# Patient Record
Sex: Female | Born: 1940 | Race: White | Hispanic: No | Marital: Married | Smoking: Never smoker
Health system: Southern US, Community
[De-identification: ages and names within clinical notes are randomized; demographics above are authoritative.]

## PROBLEM LIST (undated history)

## (undated) DIAGNOSIS — J309 Allergic rhinitis, unspecified: Secondary | ICD-10-CM

## (undated) DIAGNOSIS — K219 Gastro-esophageal reflux disease without esophagitis: Secondary | ICD-10-CM

## (undated) HISTORY — PX: TONSILLECTOMY: SUR1361

---

## 1997-06-28 ENCOUNTER — Ambulatory Visit (HOSPITAL_COMMUNITY): Admission: RE | Admit: 1997-06-28 | Discharge: 1997-06-28 | Payer: Self-pay | Admitting: *Deleted

## 1998-05-31 ENCOUNTER — Other Ambulatory Visit: Admission: RE | Admit: 1998-05-31 | Discharge: 1998-05-31 | Payer: Self-pay | Admitting: *Deleted

## 2000-10-30 ENCOUNTER — Encounter: Payer: Self-pay | Admitting: Family Medicine

## 2000-10-30 ENCOUNTER — Ambulatory Visit (HOSPITAL_COMMUNITY): Admission: RE | Admit: 2000-10-30 | Discharge: 2000-10-30 | Payer: Self-pay | Admitting: Family Medicine

## 2011-05-03 ENCOUNTER — Emergency Department (HOSPITAL_COMMUNITY)
Admission: EM | Admit: 2011-05-03 | Discharge: 2011-05-03 | Disposition: A | Discharge status: Home / Self Care | Payer: BC Managed Care – PPO | Attending: Emergency Medicine | Admitting: Emergency Medicine

## 2011-05-03 ENCOUNTER — Encounter (HOSPITAL_COMMUNITY): Payer: Self-pay | Admitting: Emergency Medicine

## 2011-05-03 DIAGNOSIS — M25439 Effusion, unspecified wrist: Secondary | ICD-10-CM | POA: Insufficient documentation

## 2011-05-03 DIAGNOSIS — M79609 Pain in unspecified limb: Secondary | ICD-10-CM | POA: Insufficient documentation

## 2011-05-03 DIAGNOSIS — S61409A Unspecified open wound of unspecified hand, initial encounter: Secondary | ICD-10-CM | POA: Insufficient documentation

## 2011-05-03 DIAGNOSIS — M2550 Pain in unspecified joint: Secondary | ICD-10-CM | POA: Insufficient documentation

## 2011-05-03 DIAGNOSIS — Z79899 Other long term (current) drug therapy: Secondary | ICD-10-CM | POA: Insufficient documentation

## 2011-05-03 DIAGNOSIS — W268XXA Contact with other sharp object(s), not elsewhere classified, initial encounter: Secondary | ICD-10-CM | POA: Insufficient documentation

## 2011-05-03 DIAGNOSIS — S61419A Laceration without foreign body of unspecified hand, initial encounter: Secondary | ICD-10-CM

## 2011-05-03 DIAGNOSIS — K219 Gastro-esophageal reflux disease without esophagitis: Secondary | ICD-10-CM | POA: Insufficient documentation

## 2011-05-03 DIAGNOSIS — W01119A Fall on same level from slipping, tripping and stumbling with subsequent striking against unspecified sharp object, initial encounter: Secondary | ICD-10-CM | POA: Insufficient documentation

## 2011-05-03 HISTORY — DX: Gastro-esophageal reflux disease without esophagitis: K21.9

## 2011-05-03 MED ORDER — ACETAMINOPHEN-CODEINE #3 300-30 MG PO TABS
1.0000 | ORAL_TABLET | Freq: Once | ORAL | Status: AC
  Administered 2011-05-03: 1 via ORAL
  Filled 2011-05-03: qty 1

## 2011-05-03 MED ORDER — TETANUS-DIPHTH-ACELL PERTUSSIS 5-2.5-18.5 LF-MCG/0.5 IM SUSP
0.5000 mL | Freq: Once | INTRAMUSCULAR | Status: AC
  Administered 2011-05-03: 0.5 mL via INTRAMUSCULAR
  Filled 2011-05-03: qty 0.5

## 2011-05-03 MED ORDER — LIDOCAINE-EPINEPHRINE-TETRACAINE (LET) SOLUTION
3.0000 mL | Freq: Once | NASAL | Status: AC
  Administered 2011-05-03: 3 mL via TOPICAL
  Filled 2011-05-03: qty 3

## 2011-05-03 NOTE — Discharge Instructions (Signed)
Please read the information below.  Follow up with your doctor, the urgent care, or the ER in 14 days for a wound check and suture removal.  Return to the ER immediately if you develop redness, swelling, pus draining from the wound, or fevers greater than 100.4.  You may return to the ER at any time for worsening condition or any new symptoms that concern you.   Laceration Care, Adult A laceration is a cut or lesion that goes through all layers of the skin and into the tissue just beneath the skin. TREATMENT  Some lacerations may not require closure. Some lacerations may not be able to be closed due to an increased risk of infection. It is important to see your caregiver as soon as possible after an injury to minimize the risk of infection and maximize the opportunity for successful closure. If closure is appropriate, pain medicines may be given, if needed. The wound will be cleaned to help prevent infection. Your caregiver will use stitches (sutures), staples, wound glue (adhesive), or skin adhesive strips to repair the laceration. These tools bring the skin edges together to allow for faster healing and a better cosmetic outcome. However, all wounds will heal with a scar. Once the wound has healed, scarring can be minimized by covering the wound with sunscreen during the day for 1 full year. HOME CARE INSTRUCTIONS  For sutures or staples:  Keep the wound clean and dry.   If you were given a bandage (dressing), you should change it at least once a day. Also, change the dressing if it becomes wet or dirty, or as directed by your caregiver.   Wash the wound with soap and water 2 times a day. Rinse the wound off with water to remove all soap. Pat the wound dry with a clean towel.   After cleaning, apply a thin layer of the antibiotic ointment as recommended by your caregiver. This will help prevent infection and keep the dressing from sticking.   You may shower as usual after the first 24 hours. Do  not soak the wound in water until the sutures are removed.   Only take over-the-counter or prescription medicines for pain, discomfort, or fever as directed by your caregiver.   Get your sutures or staples removed as directed by your caregiver.  For skin adhesive strips:  Keep the wound clean and dry.   Do not get the skin adhesive strips wet. You may bathe carefully, using caution to keep the wound dry.   If the wound gets wet, pat it dry with a clean towel.   Skin adhesive strips will fall off on their own. You may trim the strips as the wound heals. Do not remove skin adhesive strips that are still stuck to the wound. They will fall off in time.  For wound adhesive:  You may briefly wet your wound in the shower or bath. Do not soak or scrub the wound. Do not swim. Avoid periods of heavy perspiration until the skin adhesive has fallen off on its own. After showering or bathing, gently pat the wound dry with a clean towel.   Do not apply liquid medicine, cream medicine, or ointment medicine to your wound while the skin adhesive is in place. This may loosen the film before your wound is healed.   If a dressing is placed over the wound, be careful not to apply tape directly over the skin adhesive. This may cause the adhesive to be pulled off before the  wound is healed.   Avoid prolonged exposure to sunlight or tanning lamps while the skin adhesive is in place. Exposure to ultraviolet light in the first year will darken the scar.   The skin adhesive will usually remain in place for 5 to 10 days, then naturally fall off the skin. Do not pick at the adhesive film.  You may need a tetanus shot if:  You cannot remember when you had your last tetanus shot.   You have never had a tetanus shot.  If you get a tetanus shot, your arm may swell, get red, and feel warm to the touch. This is common and not a problem. If you need a tetanus shot and you choose not to have one, there is a rare chance of  getting tetanus. Sickness from tetanus can be serious. SEEK MEDICAL CARE IF:   You have redness, swelling, or increasing pain in the wound.   You see a red line that goes away from the wound.   You have yellowish-white fluid (pus) coming from the wound.   You have a fever.   You notice a bad smell coming from the wound or dressing.   Your wound breaks open before or after sutures have been removed.   You notice something coming out of the wound such as wood or glass.   Your wound is on your hand or foot and you cannot move a finger or toe.  SEEK IMMEDIATE MEDICAL CARE IF:   Your pain is not controlled with prescribed medicine.   You have severe swelling around the wound causing pain and numbness or a change in color in your arm, hand, leg, or foot.   Your wound splits open and starts bleeding.   You have worsening numbness, weakness, or loss of function of any joint around or beyond the wound.   You develop painful lumps near the wound or on the skin anywhere on your body.  MAKE SURE YOU:   Understand these instructions.   Will watch your condition.   Will get help right away if you are not doing well or get worse.  Document Released: 01/20/2005 Document Revised: 01/09/2011 Document Reviewed: 07/16/2010 [facility] Patient Information 2012 [place], [place].  Stitches, Staples, or Skin Adhesive Strips  Stitches (sutures), staples, and skin adhesive strips hold the skin together as it heals. They will usually be in place for 7 days or less. HOME CARE  Wash your hands with soap and water before and after you touch your wound.   Only take medicine as told by your doctor.   Cover your wound only if your doctor told you to. Otherwise, leave it open to air.   Do not get your stitches wet or dirty. If they get dirty, dab them gently with a clean washcloth. Wet the washcloth with soapy water. Do not rub. Pat them dry gently.   Do not put medicine or medicated cream on your  stitches unless your doctor told you to.   Do not take out your own stitches or staples. Skin adhesive strips will fall off by themselves.   Do not pick at the wound. Picking can cause an infection.   Do not miss your follow-up appointment.   If you have problems or questions, call your doctor.  GET HELP RIGHT AWAY IF:   You have a temperature by mouth above 102 F (38.9 C), not controlled by medicine.   You have chills.   You have redness or pain around your stitches.  There is puffiness (swelling) around your stitches.   You notice fluid (drainage) from your stitches.   There is a bad smell coming from your wound.  MAKE SURE YOU:  Understand these instructions.   Will watch your condition.   Will get help if you are not doing well or get worse.  Document Released: 11/17/2008 Document Revised: 01/09/2011 Document Reviewed: 11/17/2008 [facility] Patient Information 2012 [place], [place].

## 2011-05-03 NOTE — ED Provider Notes (Signed)
Laceration repair. This is a shared visit with Dr Oletta Lamas.  Patient with laceration across palm of right hand.  No tendon involvement - pt has full AROM and strength of fingers, sensation intact, capillary refill < 2 seconds.    LACERATION REPAIR Performed by: Rise Patience Consent: Verbal consent obtained. Risks and benefits: risks, benefits and alternatives were discussed Patient identity confirmed: provided demographic data Time out performed prior to procedure Prepped and Draped in normal sterile fashion Wound explored  Laceration Location: right palmar hand  Laceration Length: 5cm  No Foreign Bodies seen or palpated  Anesthesia: local infiltration  Local anesthetic: lidocaine 2% no epinephrine  Anesthetic total: 7.5 ml  Irrigation method: syringe with splash guard Amount of cleaning: extensive  Skin closure: prolene 4-0  Number of sutures or staples: 12  Technique: simple interrupted  Patient tolerance: Patient tolerated the procedure well with no immediate complications.   Discussed wound care and return precautions with patient and husband.  Patient prefers to follow up with PCP in 10-14 days for wound recheck and suture removal.  Patient and husband verbalize understanding and agree with plan.    Dillard Cannon [place], [place] 05/03/11 2022

## 2011-05-03 NOTE — ED Notes (Signed)
Pt. Stated, she was walking and sorted tripped and grabbed a pole and cut her hand

## 2011-05-03 NOTE — ED Provider Notes (Cosign Needed)
History     CSN: [id number]  Arrival date & time 05/03/11  1448   First MD Initiated Contact with Patient 05/03/11 1636      Chief Complaint  Patient presents with  . Extremity Laceration    (Consider location/radiation/quality/duration/timing/severity/associated sxs/prior treatment) HPI Comments: Patient reports that she was holding onto a metal pole that had a sharp edge to it while stepping over something and she lost her balance and when she fell she was still trying to hold onto the pole which cut into her hand. She denies any injury from the fall and denies any numbness or weakness to her hand. Bleeding had stopped after applying pressure with a bandage. She reports her tetanus is not up-to-date. She reports pain in her hand is moderate. No medications were taken prior to arrival.  The history is provided by the patient and a relative.    Past Medical History  Diagnosis Date  . Acid reflux     History reviewed. No pertinent past surgical history.  History reviewed. No pertinent family history.  History  Substance Use Topics  . Smoking status: Not on file  . Smokeless tobacco: Not on file  . Alcohol Use: No    OB History    Grav Para Term Preterm Abortions TAB SAB Ect Mult Living                  Review of Systems  Constitutional: Negative.   Musculoskeletal: Positive for arthralgias. Negative for back pain.  Skin: Positive for wound. Negative for color change.  Neurological: Negative for weakness and numbness.    Allergies  Penicillins  Home Medications   Current Outpatient Rx  Name Route Sig Dispense Refill  . FEXOFENADINE HCL 180 MG PO TABS Oral Take 180 mg by mouth daily.    Marland Kitchen OMEPRAZOLE MAGNESIUM 20 MG PO TBEC Oral Take 20 mg by mouth daily.      BP 141/108  Pulse 97  Temp(Src) 97.8 F (36.6 C) (Oral)  Resp 20  SpO2 98%  Physical Exam  Nursing note and vitals reviewed. Constitutional: She appears well-developed and well-nourished. No  distress.  HENT:  Head: Normocephalic and atraumatic.  Eyes: Pupils are equal, round, and reactive to light.  Pulmonary/Chest: Effort normal.  Musculoskeletal:       Right wrist: She exhibits swelling and laceration.       About 5 cm laceration across palmar surface of right hand, no active bleeding.  Intact ligaments with hand flexion.  No sig bleeding currently.    Neurological: She is alert.  Skin: Skin is warm and dry.    ED Course  Procedures (including critical care time)  Labs Reviewed - No data to display No results found.   No diagnosis found.    MDM  Will update tetanus, will discuss with CDU PA regarding wound closure.        Pt moved to CDU for holding and procedure by PAC.  I spoke to [facility].    Gavin Pound. Tzivia Oneil, MD 05/05/11 1710

## 2011-05-05 NOTE — ED Provider Notes (Signed)
Medical screening examination/treatment/procedure(s) were performed by non-physician practitioner and as supervising physician I was immediately available for consultation/collaboration.  I was available for procedure.  Please see my original note for initial H&P.  Gavin Pound. Barbarita Hutmacher, MD 05/05/11 1711

## 2015-03-13 DIAGNOSIS — H02839 Dermatochalasis of unspecified eye, unspecified eyelid: Secondary | ICD-10-CM | POA: Diagnosis not present

## 2015-03-13 DIAGNOSIS — H18411 Arcus senilis, right eye: Secondary | ICD-10-CM | POA: Diagnosis not present

## 2015-03-13 DIAGNOSIS — H18412 Arcus senilis, left eye: Secondary | ICD-10-CM | POA: Diagnosis not present

## 2015-03-13 DIAGNOSIS — H2511 Age-related nuclear cataract, right eye: Secondary | ICD-10-CM | POA: Diagnosis not present

## 2015-05-30 DIAGNOSIS — M25561 Pain in right knee: Secondary | ICD-10-CM | POA: Diagnosis not present

## 2015-05-30 DIAGNOSIS — M25562 Pain in left knee: Secondary | ICD-10-CM | POA: Diagnosis not present

## 2015-05-30 DIAGNOSIS — M25441 Effusion, right hand: Secondary | ICD-10-CM | POA: Diagnosis not present

## 2015-05-30 DIAGNOSIS — M19042 Primary osteoarthritis, left hand: Secondary | ICD-10-CM | POA: Diagnosis not present

## 2015-05-30 DIAGNOSIS — M19041 Primary osteoarthritis, right hand: Secondary | ICD-10-CM | POA: Diagnosis not present

## 2015-07-28 ENCOUNTER — Observation Stay (HOSPITAL_COMMUNITY)
Admission: EM | Admit: 2015-07-28 | Discharge: 2015-07-30 | Disposition: A | Discharge status: Home / Self Care | Payer: Medicare Other | Attending: Internal Medicine | Admitting: Internal Medicine

## 2015-07-28 ENCOUNTER — Encounter (HOSPITAL_COMMUNITY): Payer: Self-pay

## 2015-07-28 DIAGNOSIS — Z79899 Other long term (current) drug therapy: Secondary | ICD-10-CM | POA: Diagnosis not present

## 2015-07-28 DIAGNOSIS — R319 Hematuria, unspecified: Secondary | ICD-10-CM | POA: Diagnosis not present

## 2015-07-28 DIAGNOSIS — D638 Anemia in other chronic diseases classified elsewhere: Secondary | ICD-10-CM | POA: Diagnosis not present

## 2015-07-28 DIAGNOSIS — N12 Tubulo-interstitial nephritis, not specified as acute or chronic: Secondary | ICD-10-CM

## 2015-07-28 DIAGNOSIS — J309 Allergic rhinitis, unspecified: Secondary | ICD-10-CM | POA: Insufficient documentation

## 2015-07-28 DIAGNOSIS — Z66 Do not resuscitate: Secondary | ICD-10-CM | POA: Insufficient documentation

## 2015-07-28 DIAGNOSIS — N10 Acute tubulo-interstitial nephritis: Secondary | ICD-10-CM | POA: Diagnosis present

## 2015-07-28 DIAGNOSIS — A419 Sepsis, unspecified organism: Secondary | ICD-10-CM | POA: Diagnosis present

## 2015-07-28 DIAGNOSIS — K219 Gastro-esophageal reflux disease without esophagitis: Secondary | ICD-10-CM | POA: Diagnosis not present

## 2015-07-28 DIAGNOSIS — N390 Urinary tract infection, site not specified: Secondary | ICD-10-CM | POA: Diagnosis not present

## 2015-07-28 DIAGNOSIS — K5900 Constipation, unspecified: Secondary | ICD-10-CM | POA: Insufficient documentation

## 2015-07-28 DIAGNOSIS — R109 Unspecified abdominal pain: Secondary | ICD-10-CM | POA: Diagnosis not present

## 2015-07-28 HISTORY — DX: Allergic rhinitis, unspecified: J30.9

## 2015-07-28 LAB — CBC WITH DIFFERENTIAL/PLATELET
BASOS PCT: 0 %
Band Neutrophils: 0 %
Basophils Absolute: 0 10*3/uL (ref 0.0–0.1)
Blasts: 0 %
EOS ABS: 0 10*3/uL (ref 0.0–0.7)
EOS PCT: 0 %
HEMATOCRIT: 30.2 % — AB (ref 36.0–46.0)
HEMOGLOBIN: 10.8 g/dL — AB (ref 12.0–15.0)
LYMPHS PCT: 7 %
Lymphs Abs: 1.3 10*3/uL (ref 0.7–4.0)
MCH: 30.3 pg (ref 26.0–34.0)
MCHC: 35.8 g/dL (ref 30.0–36.0)
MCV: 84.8 fL (ref 78.0–100.0)
MONO ABS: 1.5 10*3/uL — AB (ref 0.1–1.0)
Metamyelocytes Relative: 0 %
Monocytes Relative: 8 %
Myelocytes: 0 %
NEUTROS PCT: 85 %
NRBC: 0 /100{WBCs}
Neutro Abs: 15.6 10*3/uL — ABNORMAL HIGH (ref 1.7–7.7)
OTHER: 0 %
Platelets: 140 10*3/uL — ABNORMAL LOW (ref 150–400)
Promyelocytes Absolute: 0 %
RBC: 3.56 MIL/uL — AB (ref 3.87–5.11)
RDW: 14.2 % (ref 11.5–15.5)
WBC MORPHOLOGY: INCREASED
WBC: 18.4 10*3/uL — AB (ref 4.0–10.5)

## 2015-07-28 LAB — COMPREHENSIVE METABOLIC PANEL
ALBUMIN: 3.6 g/dL (ref 3.5–5.0)
ALK PHOS: 56 U/L (ref 38–126)
ALT: 17 U/L (ref 14–54)
AST: 20 U/L (ref 15–41)
Anion gap: 8 (ref 5–15)
BILIRUBIN TOTAL: 1.1 mg/dL (ref 0.3–1.2)
BUN: 23 mg/dL — AB (ref 6–20)
CALCIUM: 9 mg/dL (ref 8.9–10.3)
CO2: 26 mmol/L (ref 22–32)
Chloride: 100 mmol/L — ABNORMAL LOW (ref 101–111)
Creatinine, Ser: 1.05 mg/dL — ABNORMAL HIGH (ref 0.44–1.00)
GFR calc Af Amer: 58 mL/min — ABNORMAL LOW (ref >60)
GFR, EST NON AFRICAN AMERICAN: 50 mL/min — AB (ref >60)
GLUCOSE: 142 mg/dL — AB (ref 65–99)
POTASSIUM: 3.8 mmol/L (ref 3.5–5.1)
Sodium: 134 mmol/L — ABNORMAL LOW (ref 135–145)
TOTAL PROTEIN: 7.9 g/dL (ref 6.5–8.1)

## 2015-07-28 LAB — URINALYSIS, ROUTINE W REFLEX MICROSCOPIC
Bilirubin Urine: NEGATIVE
GLUCOSE, UA: NEGATIVE mg/dL
KETONES UR: NEGATIVE mg/dL
Nitrite: NEGATIVE
PROTEIN: 100 mg/dL — AB
Specific Gravity, Urine: 1.021 (ref 1.005–1.030)
pH: 5.5 (ref 5.0–8.0)

## 2015-07-28 LAB — PROTIME-INR
INR: 1.3 (ref 0.00–1.49)
Prothrombin Time: 16.3 seconds — ABNORMAL HIGH (ref 11.6–15.2)

## 2015-07-28 LAB — I-STAT CG4 LACTIC ACID, ED: Lactic Acid, Venous: 0.75 mmol/L (ref 0.5–2.0)

## 2015-07-28 LAB — URINE MICROSCOPIC-ADD ON

## 2015-07-28 LAB — APTT: APTT: 34 s (ref 24–37)

## 2015-07-28 LAB — LACTIC ACID, PLASMA: Lactic Acid, Venous: 1 mmol/L (ref 0.5–2.0)

## 2015-07-28 LAB — PROCALCITONIN: PROCALCITONIN: 4.88 ng/mL

## 2015-07-28 MED ORDER — ACETAMINOPHEN 325 MG PO TABS
650.0000 mg | ORAL_TABLET | Freq: Four times a day (QID) | ORAL | Status: DC | PRN
  Administered 2015-07-28: 650 mg via ORAL
  Filled 2015-07-28 (×2): qty 2

## 2015-07-28 MED ORDER — CEFTRIAXONE SODIUM 1 G IJ SOLR
1.0000 g | INTRAMUSCULAR | Status: DC
  Administered 2015-07-29: 1 g via INTRAVENOUS
  Filled 2015-07-28: qty 10

## 2015-07-28 MED ORDER — DEXTROSE 5 % IV SOLN
1.0000 g | Freq: Once | INTRAVENOUS | Status: AC
  Administered 2015-07-28: 1 g via INTRAVENOUS
  Filled 2015-07-28: qty 10

## 2015-07-28 MED ORDER — LACTATED RINGERS IV SOLN
INTRAVENOUS | Status: AC
  Administered 2015-07-28 – 2015-07-29 (×2): via INTRAVENOUS
  Filled 2015-07-28 (×3): qty 1000

## 2015-07-28 MED ORDER — SODIUM CHLORIDE 0.9 % IV BOLUS (SEPSIS)
1000.0000 mL | Freq: Once | INTRAVENOUS | Status: AC
  Administered 2015-07-28: 1000 mL via INTRAVENOUS

## 2015-07-28 MED ORDER — ENOXAPARIN SODIUM 40 MG/0.4ML ~~LOC~~ SOLN
40.0000 mg | SUBCUTANEOUS | Status: DC
Start: 2015-07-28 — End: 2015-07-30
  Administered 2015-07-29 (×2): 40 mg via SUBCUTANEOUS
  Filled 2015-07-28 (×2): qty 0.4

## 2015-07-28 MED ORDER — DOCUSATE SODIUM 100 MG PO CAPS
100.0000 mg | ORAL_CAPSULE | Freq: Two times a day (BID) | ORAL | Status: DC
  Administered 2015-07-29: 100 mg via ORAL
  Filled 2015-07-28: qty 1

## 2015-07-28 MED ORDER — MORPHINE SULFATE (PF) 2 MG/ML IV SOLN
2.0000 mg | INTRAVENOUS | Status: DC | PRN
  Administered 2015-07-29 (×2): 2 mg via INTRAVENOUS
  Filled 2015-07-28 (×2): qty 1

## 2015-07-28 MED ORDER — ACETAMINOPHEN 650 MG RE SUPP: 650.0000 mg | Freq: Four times a day (QID) | RECTAL | Status: DC | PRN

## 2015-07-28 NOTE — ED Notes (Signed)
Pt can go up at 17:35.

## 2015-07-28 NOTE — ED Provider Notes (Signed)
CSN: [id number]     Arrival date & time 07/28/15  1520 History   First MD Initiated Contact with Patient 07/28/15 1531     Chief Complaint  Patient presents with  . Flank Pain    BILATERAL X3 DAYS     (Consider location/radiation/quality/duration/timing/severity/associated sxs/prior Treatment) Patient is a 75 y.o. female presenting with flank pain. The history is provided by the patient.  Flank Pain This is a new problem. Episode onset: 2-3 days ago. The problem occurs constantly. The problem has not changed since onset.Associated symptoms include abdominal pain. Associated symptoms comments: Firm stools, constipation, has not taken her normal stool softeners . Nothing aggravates the symptoms. Nothing relieves the symptoms. Treatments tried: zofran with EMS. The treatment provided moderate relief.    Past Medical History  Diagnosis Date  . Acid reflux    History reviewed. No pertinent past surgical history. History reviewed. No pertinent family history. Social History  Substance Use Topics  . Smoking status: Never Smoker   . Smokeless tobacco: None  . Alcohol Use: No   OB History    No data available     Review of Systems  Gastrointestinal: Positive for abdominal pain.  Genitourinary: Positive for flank pain.  All other systems reviewed and are negative.     Allergies  Penicillins  Home Medications   Prior to Admission medications   Medication Sig Start Date End Date Taking? Authorizing Provider  fexofenadine (ALLEGRA) 180 MG tablet Take 180 mg by mouth daily.    Historical Provider, MD  omeprazole (PRILOSEC OTC) 20 MG tablet Take 20 mg by mouth daily.    Historical Provider, MD   BP 133/79 mmHg  Pulse 95  Temp(Src) 99.3 F (37.4 C) (Rectal)  Resp 17  Ht 5\' 2"  (1.575 m)  Wt 130 lb (58.968 kg)  BMI 23.77 kg/m2  SpO2 100% Physical Exam  Constitutional: She is oriented to person, place, and time. She appears well-developed and well-nourished. No distress.   HENT:  Head: Normocephalic.  Eyes: Conjunctivae are normal.  Neck: Neck supple. No tracheal deviation present.  Cardiovascular: Normal rate and regular rhythm.   Pulmonary/Chest: Effort normal. No respiratory distress.  Abdominal: Soft. She exhibits no distension. There is no tenderness. There is no rebound and no guarding.  Neurological: She is alert and oriented to person, place, and time.  Skin: Skin is warm and dry.  Psychiatric: She has a normal mood and affect.    ED Course  Procedures (including critical care time) Emergency Focused Ultrasound Exam Limited Retroperitoneal Ultrasound of Kidneys  Performed and interpreted by Dr. Clydene PughKnott Focused abdominal ultrasound with both kidneys imaged in transverse and longitudinal planes in real-time. Indication: flank pain Findings: bilateral kidneys present, no shadowing, no anechoic areas Interpretation: no hydronephrosis visualized.  no stones or cysts visualized  Images archived electronically  CPT Code: [id number]   Labs Review Labs Reviewed  URINE CULTURE - Abnormal; Notable for the following:    Culture >=100,000 COLONIES/mL GRAM NEGATIVE RODS (*)    All other components within normal limits  CBC WITH DIFFERENTIAL/PLATELET - Abnormal; Notable for the following:    WBC 18.4 (*)    RBC 3.56 (*)    Hemoglobin 10.8 (*)    HCT 30.2 (*)    Platelets 140 (*)    Neutro Abs 15.6 (*)    Monocytes Absolute 1.5 (*)    All other components within normal limits  COMPREHENSIVE METABOLIC PANEL - Abnormal; Notable for the following:    Sodium  134 (*)    Chloride 100 (*)    Glucose, Bld 142 (*)    BUN 23 (*)    Creatinine, Ser 1.05 (*)    GFR calc non Af Amer 50 (*)    GFR calc Af Amer 58 (*)    All other components within normal limits  URINALYSIS, ROUTINE W REFLEX MICROSCOPIC (NOT AT [facility]) - Abnormal; Notable for the following:    Color, Urine AMBER (*)    APPearance CLOUDY (*)    Hgb urine dipstick MODERATE (*)    Protein, ur 100  (*)    Leukocytes, UA SMALL (*)    All other components within normal limits  URINE MICROSCOPIC-ADD ON - Abnormal; Notable for the following:    Squamous Epithelial / LPF 0-5 (*)    Bacteria, UA MANY (*)    All other components within normal limits  PROTIME-INR - Abnormal; Notable for the following:    Prothrombin Time 16.3 (*)    All other components within normal limits  CBC - Abnormal; Notable for the following:    WBC 14.1 (*)    RBC 3.14 (*)    Hemoglobin 8.8 (*)    HCT 26.8 (*)    Platelets 115 (*)    All other components within normal limits  BASIC METABOLIC PANEL - Abnormal; Notable for the following:    Sodium 134 (*)    Glucose, Bld 130 (*)    Calcium 8.2 (*)    All other components within normal limits  CULTURE, BLOOD (ROUTINE X 2)  CULTURE, BLOOD (ROUTINE X 2)  LACTIC ACID, PLASMA  LACTIC ACID, PLASMA  PROCALCITONIN  APTT  I-STAT CG4 LACTIC ACID, ED  I-STAT CG4 LACTIC ACID, ED  I-STAT CG4 LACTIC ACID, ED    Imaging Review [facility]  07/29/2015  CLINICAL DATA:  Pyelonephritis. EXAM: RENAL / URINARY TRACT ULTRASOUND COMPLETE COMPARISON:  None. FINDINGS: Right Kidney: Length: 12.2 cm. Echogenicity within normal limits. No mass or hydronephrosis visualized. No perinephric fluid. Left Kidney: Length: 11.6 cm. Echogenicity within normal limits. No mass or hydronephrosis visualized. No perinephric fluid. Bladder: Appears normal for degree of bladder distention. IMPRESSION: Normal renal ultrasound. Electronically Signed   By: Bary RichardStan  Maynard M.D.   On: 07/29/2015 11:07   I have personally reviewed and evaluated these images and lab results as part of my medical decision-making.   EKG Interpretation None      MDM   Final diagnoses:  Sepsis, due to unspecified organism [facility](HCC)  Urinary tract infection with hematuria, site unspecified    75 y.o. female presents with bilateral flank pain worse on right, weakness, intermittent fever and foul smelling urine. Urine shows  UTI, has HR>90 and elevated WBC qualifying for sepsis so protocol initiated with fluids and empiric ABx for UTI. Hospitalist was consulted for admission and will see the patient in the emergency department.     Lyndal Pulleyaniel Keldric Poyer, MD 07/30/15 [phone]

## 2015-07-28 NOTE — H&P (Signed)
History and Physical    Patricia MareMartha G Corey WJX:[id number] DOB: [birth year] DOA: 07/28/2015  PCP: Sissy HoffSWAYNE,DAVID W, MD; only saw once - has an appointment for Aug 1 PA [place](?) [facility]- [facility]:  None Patient coming from: home  Chief Complaint: AMS  HPI: Patricia Benitez is a 75 y.o. female without significant past medical history presenting with pain from UTI and confusion.  Pain initially started on R back and then moved to R flank/abdomen.  +fevers to Tmax 99.  Symptoms for 3 days.  No dysuria but minimal voiding due to decreased PO intake.  No hematuria.  +confusion - trouble remembering which day it is, talking jibberish per husband, mumbling and moaning.  Has shown improvement since arriving in the ER.  Was sick 2-3 weeks ago with chills and generalized myalgias, slept for 5 days/nights and then improved, but did not require medications.      ED Course:  IVF bolus 1000 mL, Rocephin 1 gram  Review of Systems: As per HPI; otherwise 10 point review of systems reviewed and negative.   Ambulatory Status: ambulatory  Past Medical History  Diagnosis Date  . Acid reflux   . Allergic rhinitis     Past Surgical History  Procedure Laterality Date  . Tonsillectomy      Social History   Social History  . Marital Status: Married    Spouse Name: N/A  . Number of Children: N/A  . Years of Education: N/A   Occupational History  . Not on file.   Social History Main Topics  . Smoking status: Never Smoker   . Smokeless tobacco: Not on file  . Alcohol Use: No  . Drug Use: No  . Sexual Activity: Not on file   Other Topics Concern  . Not on file   Social History Narrative    Allergies  Allergen Reactions  . Penicillins Hives    Has patient had a PCN reaction causing immediate rash, facial/tongue/throat swelling, SOB or lightheadedness with hypotension: yes Has patient had a PCN reaction causing severe rash involving mucus membranes or skin necrosis: No Has  patient had a PCN reaction that required hospitalization No Has patient had a PCN reaction occurring within the last 10 years: No If all of the above answers are "NO", then may proceed with Cephalosporin use.     History reviewed. No pertinent family history.  Prior to Admission medications   Medication Sig Start Date End Date Taking? Authorizing Provider  DiphenhydrAMINE HCl (ALLERGY MEDICATION PO) Take by mouth every morning. "Equate" allergy medication.   Yes Historical Provider, MD  DOCUSATE SODIUM PO Take 1 capsule by mouth 2 (two) times daily.   Yes Historical Provider, MD  omeprazole (PRILOSEC OTC) 20 MG tablet Take 20 mg by mouth daily.   Yes Historical Provider, MD    Physical Exam: Filed Vitals:   07/28/15 1910 07/28/15 1952 07/28/15 2000 07/28/15 2028  BP: 100/61  122/63 128/55  Pulse: 87  89 84  Temp:  100 F (37.8 C)  99.4 F (37.4 C)  TempSrc:  Oral  Oral  Resp: 18   18  Height:    5\' 2"  (1.575 m)  Weight:    60.419 kg (133 lb 3.2 oz)  SpO2: 96%  96% 99%     General:  Appears calm and comfortable and is NAD Eyes: PERRL, EOMI, normal lids, iris ENT:  grossly normal hearing, lips & tongue, mmm Neck:  no LAD, masses or thyromegaly Cardiovascular:  RRR,  no m/r/g. No LE edema.  Respiratory:  CTA bilaterally, no w/r/r. Normal respiratory effort. Abdomen:  soft, ntnd, NABS, no suprapubic TTP Back: mild-moderate R CVA tenderness Skin: no rash or induration seen on limited exam Musculoskeletal:  grossly normal tone BUE/BLE, good ROM, no bony abnormality Psychiatric:  grossly normal mood and affect, speech fluent and appropriate, AOx3 Neurologic:  CN 2-12 grossly intact, moves all extremities in coordinated fashion, sensation intact  Labs on Admission: I have personally reviewed following labs and imaging studies  CBC:  Recent Labs Lab 07/28/15 1630  WBC 18.4*  NEUTROABS 15.6*  HGB 10.8*  HCT 30.2*  MCV 84.8  PLT 140*   Basic Metabolic Panel:  Recent  Labs Lab 07/28/15 1630  NA 134*  K 3.8  CL 100*  CO2 26  GLUCOSE 142*  BUN 23*  CREATININE 1.05*  CALCIUM 9.0   GFR: Estimated Creatinine Clearance: 38.4 mL/min (by C-G formula based on Cr of 1.05). Liver Function Tests:  Recent Labs Lab 07/28/15 1630  AST 20  ALT 17  ALKPHOS 56  BILITOT 1.1  PROT 7.9  ALBUMIN 3.6   No results for input(s): LIPASE, AMYLASE in the last 168 hours. No results for input(s): AMMONIA in the last 168 hours. Coagulation Profile: No results for input(s): INR, PROTIME in the last 168 hours. Cardiac Enzymes: No results for input(s): CKTOTAL, CKMB, CKMBINDEX, TROPONINI in the last 168 hours. BNP (last 3 results) No results for input(s): PROBNP in the last 8760 hours. HbA1C: No results for input(s): HGBA1C in the last 72 hours. CBG: No results for input(s): GLUCAP in the last 168 hours. Lipid Profile: No results for input(s): CHOL, HDL, LDLCALC, TRIG, CHOLHDL, LDLDIRECT in the last 72 hours. Thyroid Function Tests: No results for input(s): TSH, T4TOTAL, FREET4, T3FREE, THYROIDAB in the last 72 hours. Anemia Panel: No results for input(s): VITAMINB12, FOLATE, FERRITIN, TIBC, IRON, RETICCTPCT in the last 72 hours. Urine analysis:    Component Value Date/Time   COLORURINE AMBER* 07/28/2015 1640   APPEARANCEUR CLOUDY* 07/28/2015 1640   LABSPEC 1.021 07/28/2015 1640   PHURINE 5.5 07/28/2015 1640   GLUCOSEU NEGATIVE 07/28/2015 1640   HGBUR MODERATE* 07/28/2015 1640   BILIRUBINUR NEGATIVE 07/28/2015 1640   KETONESUR NEGATIVE 07/28/2015 1640   PROTEINUR 100* 07/28/2015 1640   NITRITE NEGATIVE 07/28/2015 1640   LEUKOCYTESUR SMALL* 07/28/2015 1640    Creatinine Clearance: Estimated Creatinine Clearance: 38.4 mL/min (by C-G formula based on Cr of 1.05).  Sepsis Labs: (procalcitonin:4,lacticidven:4) )No results found for this or any previous visit (from the past 240 hour(s)).   Radiological Exams on Admission: No results  found.  EKG: None  Assessment/Plan Active Problems:   Sepsis (HCC)   Pyelonephritis, acute    Patient with acute onset of pyelonephritis 2-3 days ago, although she was also sick about 2-3 weeks ago and this may have been related.  She does have 2 SIRS criteria (leukocytosis, tachycardia) and so meets criteria for sepsis, qSOFA score 1 for AMS.  Will place in observation status on the med-surg unit and continue IV Rocephin with pharmacy to dose (increased dose to 2 grams IV q24h).  As patient is now back to baseline mental status and appears no longer dehydrated with no further nausea in the ER; will allow patient to eat regular diet and hold further IVF at this time based on normal lactate and normal BP.  Will trend lactate and re-bolus prn.   DVT prophylaxis:  Lovenox Code Status: DNR - confirmed with patient/family  Family Communication: husband present throughout and in agreement with plan Disposition Plan:  Home once clinically improved; may need physical therapy evaluation Consults called: none Admission status: observation, does not require telemetry at this time    Jonah BlueJennifer Oseph Imburgia MD [facility]  If 7PM-7AM, please contact night-coverage [email or web address] Password [email or web address]  07/28/2015, 9:32 PM

## 2015-07-28 NOTE — ED Notes (Signed)
PT RECEIVED FROM HOME VIA EMS C/O BILATERAL FLANK PAIN, FOUL-SMELLING URINE, AND FEVER X3 DAYS.PT ALSO HAD N/V AND DIZZINESS. EMS GAVE ZOFRAN 8MG  IV X1 PTA.

## 2015-07-28 NOTE — Progress Notes (Signed)
Pharmacy Antibiotic Note  Lynetta MareMartha G Witkop is a 75 y.o. female admitted on 07/28/2015 with sepsis and UTI.  Pharmacy has been consulted for ceftriaxone dosing.  Plan:  Ceftriaxone 2g IV x 1 ordered by MD, then continue with 1g IV q24h  No further dose adjustments needed - pharmacy will sign off.  Please re-consult if needed  Height: 5\' 2"  (157.5 cm) Weight: 133 lb 3.2 oz (60.419 kg) IBW/kg (Calculated) : 50.1  Temp (24hrs), Avg:99.6 F (37.6 C), Min:99.3 F (37.4 C), Max:100 F (37.8 C)   Recent Labs Lab 07/28/15 1630 07/28/15 1847  WBC 18.4*  --   CREATININE 1.05*  --   LATICACIDVEN  --  0.75    Estimated Creatinine Clearance: 38.4 mL/min (by C-G formula based on Cr of 1.05).    Allergies  Allergen Reactions  . Penicillins Hives    Has patient had a PCN reaction causing immediate rash, facial/tongue/throat swelling, SOB or lightheadedness with hypotension: yes Has patient had a PCN reaction causing severe rash involving mucus membranes or skin necrosis: No Has patient had a PCN reaction that required hospitalization No Has patient had a PCN reaction occurring within the last 10 years: No If all of the above answers are "NO", then may proceed with Cephalosporin use.     Antimicrobials this admission: 6/24 >> Ceftriaxone >>  Dose adjustments this admission: ---  Microbiology results: 6/24 BCx: sent 6/24 UCx: sent  Thank you for allowing pharmacy to be a part of this patient's care.  Loralee PacasErin Catarina Huntley, PharmD, BCPS Pager: [phone] 07/28/2015 8:30 PM

## 2015-07-29 ENCOUNTER — Observation Stay (HOSPITAL_COMMUNITY): Payer: Medicare Other

## 2015-07-29 DIAGNOSIS — N10 Acute tubulo-interstitial nephritis: Secondary | ICD-10-CM | POA: Diagnosis not present

## 2015-07-29 DIAGNOSIS — N136 Pyonephrosis: Secondary | ICD-10-CM | POA: Diagnosis not present

## 2015-07-29 LAB — BASIC METABOLIC PANEL
ANION GAP: 6 (ref 5–15)
BUN: 15 mg/dL (ref 6–20)
CHLORIDE: 104 mmol/L (ref 101–111)
CO2: 24 mmol/L (ref 22–32)
Calcium: 8.2 mg/dL — ABNORMAL LOW (ref 8.9–10.3)
Creatinine, Ser: 0.69 mg/dL (ref 0.44–1.00)
GFR calc Af Amer: >60 mL/min (ref >60)
GFR calc non Af Amer: >60 mL/min (ref >60)
GLUCOSE: 130 mg/dL — AB (ref 65–99)
POTASSIUM: 3.7 mmol/L (ref 3.5–5.1)
Sodium: 134 mmol/L — ABNORMAL LOW (ref 135–145)

## 2015-07-29 LAB — CBC
HEMATOCRIT: 26.8 % — AB (ref 36.0–46.0)
Hemoglobin: 8.8 g/dL — ABNORMAL LOW (ref 12.0–15.0)
MCH: 28 pg (ref 26.0–34.0)
MCHC: 32.8 g/dL (ref 30.0–36.0)
MCV: 85.4 fL (ref 78.0–100.0)
Platelets: 115 10*3/uL — ABNORMAL LOW (ref 150–400)
RBC: 3.14 MIL/uL — AB (ref 3.87–5.11)
RDW: 14.3 % (ref 11.5–15.5)
WBC: 14.1 10*3/uL — AB (ref 4.0–10.5)

## 2015-07-29 LAB — LACTIC ACID, PLASMA: Lactic Acid, Venous: 1 mmol/L (ref 0.5–2.0)

## 2015-07-29 MED ORDER — DOCUSATE SODIUM 100 MG PO CAPS
200.0000 mg | ORAL_CAPSULE | Freq: Two times a day (BID) | ORAL | Status: DC
  Administered 2015-07-29 – 2015-07-30 (×3): 200 mg via ORAL
  Filled 2015-07-29 (×3): qty 2

## 2015-07-29 MED ORDER — POLYETHYLENE GLYCOL 3350 17 G PO PACK
17.0000 g | PACK | Freq: Two times a day (BID) | ORAL | Status: DC
  Administered 2015-07-29 – 2015-07-30 (×3): 17 g via ORAL
  Filled 2015-07-29 (×3): qty 1

## 2015-07-29 MED ORDER — ONDANSETRON HCL 4 MG/2ML IJ SOLN
4.0000 mg | Freq: Four times a day (QID) | INTRAMUSCULAR | Status: DC | PRN
  Administered 2015-07-29 (×2): 4 mg via INTRAVENOUS
  Filled 2015-07-29 (×2): qty 2

## 2015-07-29 NOTE — Progress Notes (Signed)
Of note: on admission pt reports she had a tick bite about 3 weeks ago and there is a small scab on postterior l knee.  No s/s of infection in that area and denies pain.  Pt did have an episode of nausea without vomiting and pain of a 5 in her abdomen at 0315.  Abd soft, + bs, is passing flatus.  Order received for Zofran.  Both MSo4 and Zofran given and pt obtained relief.

## 2015-07-29 NOTE — Care Management Obs Status (Signed)
MEDICARE OBSERVATION STATUS NOTIFICATION   Patient Details  Name: Patricia Benitez MRN: [id number] Date of Birth: 06-14-1940   Medicare Observation Status Notification Given:  Yes    Elliot CousinShavis, Daryus Sowash Ellen, RN 07/29/2015, 6:15 PM

## 2015-07-29 NOTE — Progress Notes (Addendum)
PROGRESS NOTE                                                                                                                                                                                                             Patient Demographics:    Patricia Benitez, is a 75 y.o. female, DOB - [birth year], [id number]  Admit date - 07/28/2015   Admitting Physician Jonah BlueJennifer Yates, MD  Outpatient Primary MD for the patient is Sissy HoffSWAYNE,DAVID W, MD  LOS -   Chief Complaint  Patient presents with  . Flank Pain    BILATERAL X3 DAYS       Brief Narrative   Patricia Benitez is a 75 y.o. female without significant past medical history presenting with pain from UTI and confusion. Pain initially started on R back and then moved to R flank/abdomen. +fevers to Tmax 99. Symptoms for 3 days. No dysuria but minimal voiding due to decreased PO intake. No hematuria.+ confusion - trouble remembering which day it is, talking jibberish per husband, mumbling and moaning. Has shown improvement since arriving in the ER. Was sick 2-3 weeks ago with chills and generalized myalgias, slept for 5 days/nights and then improved, but did not require medications.     Subjective:    Patricia Benitez today has, No headache, No chest pain, Mild R flank abdominal pain - No Nausea, No new weakness tingling or numbness, No Cough - SOB.     Assessment  & Plan :     1. R.sided Pyelonephritis - no sepsis, stable and improved on emperic IV Rocephin and IVF - both continue, lactic acid stable, looks non toxic, monitor blood cultures, check Renal US to rule out obstruction, likely DC in am.  2. GERD - PPI.  3. Constipation - placed on Bowel regimen.  4. AOCD - outpt follow up.   Code Status :  DO NOT RESUSCITATE  Family Communication  :  None present  Disposition Plan  :  Home tomorrow  Consults  :  None  Procedures  :    Renal US  DVT Prophylaxis  :   Lovenox   Lab Results  Component Value Date   PLT 115* 07/29/2015    Inpatient Medications  Scheduled Meds: . cefTRIAXone (ROCEPHIN)  IV  1 g Intravenous Q24H  . docusate sodium  200 mg Oral  BID  . enoxaparin (LOVENOX) injection  40 mg Subcutaneous Q24H  . polyethylene glycol  17 g Oral BID   Continuous Infusions: . lactated ringers with kcl 75 mL/hr at 07/28/15 2114   PRN Meds:.acetaminophen **OR** [DISCONTINUED] acetaminophen, morphine injection, ondansetron (ZOFRAN) IV  Antibiotics  :    Anti-infectives    Start     Dose/Rate Route Frequency Ordered Stop   07/29/15 2200  cefTRIAXone (ROCEPHIN) 1 g in dextrose 5 % 50 mL IVPB     1 g 100 mL/hr over 30 Minutes Intravenous Every 24 hours 07/28/15 2036     07/28/15 2200  cefTRIAXone (ROCEPHIN) 1 g in dextrose 5 % 50 mL IVPB     1 g 100 mL/hr over 30 Minutes Intravenous  Once 07/28/15 2026 07/28/15 2310   07/28/15 1815  cefTRIAXone (ROCEPHIN) 1 g in dextrose 5 % 50 mL IVPB     1 g 100 mL/hr over 30 Minutes Intravenous  Once 07/28/15 1808 07/28/15 1951         Objective:   Filed Vitals:   07/28/15 2028 07/28/15 2225 07/29/15 0242 07/29/15 0541  BP: 128/55 100/54 160/90 117/66  Pulse: 84 80  89  Temp: 99.4 F (37.4 C) 98.3 F (36.8 C) 99.2 F (37.3 C) 99.2 F (37.3 C)  TempSrc: Oral Oral Oral Oral  Resp: 18 18  20   Height: 5\' 2"  (1.575 m)     Weight: 60.419 kg (133 lb 3.2 oz)     SpO2: 99% 97% 98% 96%    Wt Readings from Last 3 Encounters:  07/28/15 60.419 kg (133 lb 3.2 oz)     Intake/Output Summary (Last 24 hours) at 07/29/15 0932 Last data filed at 07/29/15 0700  Gross per 24 hour  Intake 508.75 ml  Output    750 ml  Net -241.25 ml     Physical Exam  Awake Alert, Oriented X 3, No new F.N deficits, Normal affect [place].AT,PERRAL Supple Neck,No JVD, No cervical lymphadenopathy appriciated.  Symmetrical Chest wall movement, Good air movement bilaterally, CTAB RRR,No Gallops,Rubs or new Murmurs, No  Parasternal Heave +ve B.Sounds, Abd Soft, No tenderness, No organomegaly appriciated, No rebound - guarding or rigidity. Mild Right-sided flank tenderness and pain No Cyanosis, Clubbing or edema, No new Rash or bruise     Data Review:    CBC  Recent Labs Lab 07/28/15 1630 07/29/15 0508  WBC 18.4* 14.1*  HGB 10.8* 8.8*  HCT 30.2* 26.8*  PLT 140* 115*  MCV 84.8 85.4  MCH 30.3 28.0  MCHC 35.8 32.8  RDW 14.2 14.3  LYMPHSABS 1.3  --   MONOABS 1.5*  --   EOSABS 0.0  --   BASOSABS 0.0  --     Chemistries   Recent Labs Lab 07/28/15 1630 07/29/15 0508  NA 134* 134*  K 3.8 3.7  CL 100* 104  CO2 26 24  GLUCOSE 142* 130*  BUN 23* 15  CREATININE 1.05* 0.69  CALCIUM 9.0 8.2*  AST 20  --   ALT 17  --   ALKPHOS 56  --   BILITOT 1.1  --    ------------------------------------------------------------------------------------------------------------------ No results for input(s): CHOL, HDL, LDLCALC, TRIG, CHOLHDL, LDLDIRECT in the last 72 hours.  No results found for: HGBA1C ------------------------------------------------------------------------------------------------------------------ No results for input(s): TSH, T4TOTAL, T3FREE, THYROIDAB in the last 72 hours.  Invalid input(s): FREET3 ------------------------------------------------------------------------------------------------------------------ No results for input(s): VITAMINB12, FOLATE, FERRITIN, TIBC, IRON, RETICCTPCT in the last 72 hours.  Coagulation profile  Recent Labs Lab  07/28/15 2113  INR 1.30   Lactic Acid, Venous    Component Value Date/Time   LATICACIDVEN 1.0 07/29/2015 0032    No results for input(s): DDIMER in the last 72 hours.  Cardiac Enzymes No results for input(s): CKMB, TROPONINI, MYOGLOBIN in the last 168 hours.  Invalid input(s): CK ------------------------------------------------------------------------------------------------------------------ No results found for:  BNP  Micro Results No results found for this or any previous visit (from the past 240 hour(s)).  Radiology Reports No results found.  Time Spent in minutes  30   Raudel Bazen K M.D on 07/29/2015 at 9:32 AM  Between 7am to 7pm - Pager - [phone]  After 7pm go to [email or web address] - password [facility]  [facility] -  Office  [phone]

## 2015-07-30 DIAGNOSIS — N10 Acute tubulo-interstitial nephritis: Secondary | ICD-10-CM | POA: Diagnosis not present

## 2015-07-30 MED ORDER — CEFPODOXIME PROXETIL 200 MG PO TABS: 200.0000 mg | ORAL_TABLET | Freq: Two times a day (BID) | ORAL | Status: AC

## 2015-07-30 MED ORDER — BISACODYL 10 MG RE SUPP
10.0000 mg | Freq: Every day | RECTAL | Status: DC
  Administered 2015-07-30: 10 mg via RECTAL
  Filled 2015-07-30: qty 1

## 2015-07-30 NOTE — Care Management Note (Signed)
Case Management Note  Patient Details  Name: Patricia Benitez MRN: [id number] Date of Birth: 02/22/1940  Subjective/Objective: 75 y/o f admitted w/Sepsis. From home.                   Action/Plan:d/c home.   Expected Discharge Date:                Expected Discharge Plan:  Home/Self Care  In-House Referral:     Discharge planning Services  CM Consult  Post Acute Care Choice:    Choice offered to:     DME Arranged:    DME Agency:     HH Arranged:    HH Agency:     Status of Service:  Completed, signed off  If discussed at [employer] Length of Stay Meetings, dates discussed:    Additional Comments:  Lanier ClamMahabir, Anup Brigham, RN 07/30/2015, 10:54 AM

## 2015-07-30 NOTE — Discharge Instructions (Signed)
Follow with Primary MD Sissy HoffSWAYNE,DAVID W, MD in 3 days, follow final urine culture results  Get CBC, CMP, 2 view Chest X ray checked  by Primary MD next visit.    Activity: As tolerated with Full fall precautions use walker/cane & assistance as needed   Disposition Home     Diet:   Heart Healthy    For Heart failure patients - Check your Weight same time everyday, if you gain over 2 pounds, or you develop in leg swelling, experience more shortness of breath or chest pain, call your Primary MD immediately. Follow Cardiac Low Salt Diet and 1.5 lit/day fluid restriction.   On your next visit with your primary care physician please Get Medicines reviewed and adjusted.   Please request your Prim.MD to go over all Hospital Tests and Procedure/Radiological results at the follow up, please get all Hospital records sent to your Prim MD by signing hospital release before you go home.   If you experience worsening of your admission symptoms, develop shortness of breath, life threatening emergency, suicidal or homicidal thoughts you must seek medical attention immediately by calling 911 or calling your MD immediately  if symptoms less severe.  You Must read complete instructions/literature along with all the possible adverse reactions/side effects for all the Medicines you take and that have been prescribed to you. Take any new Medicines after you have completely understood and accpet all the possible adverse reactions/side effects.   Do not drive, operate heavy machinery, perform activities at heights, swimming or participation in water activities or provide baby sitting services if your were admitted for syncope or siezures until you have seen by Primary MD or a Neurologist and advised to do so again.  Do not drive when taking Pain medications.    Do not take more than prescribed Pain, Sleep and Anxiety Medications  Special Instructions: If you have smoked or chewed Tobacco  in the last 2 yrs  please stop smoking, stop any regular Alcohol  and or any Recreational drug use.  Wear Seat belts while driving.   Please note  You were cared for by a hospitalist during your hospital stay. If you have any questions about your discharge medications or the care you received while you were in the hospital after you are discharged, you can call the unit and asked to speak with the hospitalist on call if the hospitalist that took care of you is not available. Once you are discharged, your primary care physician will handle any further medical issues. Please note that NO REFILLS for any discharge medications will be authorized once you are discharged, as it is imperative that you return to your primary care physician (or establish a relationship with a primary care physician if you do not have one) for your aftercare needs so that they can reassess your need for medications and monitor your lab values.

## 2015-07-30 NOTE — Discharge Summary (Signed)
Patricia Benitez, is a 75 y.o. female  DOB 01/06/1941  MRN [id number].  Admission date:  07/28/2015  Admitting Physician  Jonah Blue, MD  Discharge Date:  07/30/2015   Primary MD  Sissy Hoff, MD  Recommendations for primary care physician for things to follow:   Check final urine culture results in 3 days. Check CBC BMP next visit.  Outpatient age-appropriate anemia workup   Admission Diagnosis  Sepsis, due to unspecified organism (HCC) [A41.9] Urinary tract infection with hematuria, site unspecified [N39.0, R31.9]   Discharge Diagnosis  Sepsis, due to unspecified organism (HCC) [A41.9] Urinary tract infection with hematuria, site unspecified [N39.0, R31.9]    Active Problems:   Sepsis (HCC)   Pyelonephritis, acute      Past Medical History  Diagnosis Date  . Acid reflux   . Allergic rhinitis     Past Surgical History  Procedure Laterality Date  . Tonsillectomy         HPI  from the history and physical done on the day of admission:    Patricia Benitez is a 75 y.o. female without significant past medical history presenting with pain from UTI and confusion. Pain initially started on R back and then moved to R flank/abdomen. +fevers to Tmax 99. Symptoms for 3 days. No dysuria but minimal voiding due to decreased PO intake. No hematuria.+ confusion - trouble remembering which day it is, talking jibberish per husband, mumbling and moaning. Has shown improvement since arriving in the ER. Was sick 2-3 weeks ago with chills and generalized myalgias, slept for 5 days/nights and then improved, but did not require medications.     Hospital Course:     1. R.sided Pyelonephritis - no sepsis, lactic acid stable, looks non toxic, so for negative blood cultures, urine cultures growing  gram-negative rods, Stable renal ultrasound ruled out any obstruction, close to baseline with empiric IV Rocephin, placed on 10 more days of oral Vantin and discharge, request PCP to check final urine culture results in 3-4 days.  2. GERD - PPI.  3. Constipation - placed on Bowel regimen.  4. AOCD - outpt follow up.    Follow UP  Follow-up Information    Follow up with [facility], MD. Schedule an appointment as soon as possible for a visit in 3 days.   Specialty:  Family Medicine   Contact information:   [phone] [address] [place] [address] [phone]        Consults obtained - None  Discharge Condition: Stable  Diet and Activity recommendation: See Discharge Instructions below  Discharge Instructions       Discharge Instructions    Diet - low sodium heart healthy    Complete by:  As directed      Discharge instructions    Complete by:  As directed   Follow with Primary MD Sissy Hoff, MD in 3 days, follow final urine culture results  Get CBC, CMP, 2 view Chest X ray checked  by Primary MD next  visit.    Activity: As tolerated with Full fall precautions use walker/cane & assistance as needed   Disposition Home     Diet:   Heart Healthy    For Heart failure patients - Check your Weight same time everyday, if you gain over 2 pounds, or you develop in leg swelling, experience more shortness of breath or chest pain, call your Primary MD immediately. Follow Cardiac Low Salt Diet and 1.5 lit/day fluid restriction.   On your next visit with your primary care physician please Get Medicines reviewed and adjusted.   Please request your Prim.MD to go over all Hospital Tests and Procedure/Radiological results at the follow up, please get all Hospital records sent to your Prim MD by signing hospital release before you go home.   If you experience worsening of your admission symptoms, develop shortness of breath, life threatening emergency, suicidal  or homicidal thoughts you must seek medical attention immediately by calling 911 or calling your MD immediately  if symptoms less severe.  You Must read complete instructions/literature along with all the possible adverse reactions/side effects for all the Medicines you take and that have been prescribed to you. Take any new Medicines after you have completely understood and accpet all the possible adverse reactions/side effects.   Do not drive, operate heavy machinery, perform activities at heights, swimming or participation in water activities or provide baby sitting services if your were admitted for syncope or siezures until you have seen by Primary MD or a Neurologist and advised to do so again.  Do not drive when taking Pain medications.    Do not take more than prescribed Pain, Sleep and Anxiety Medications  Special Instructions: If you have smoked or chewed Tobacco  in the last 2 yrs please stop smoking, stop any regular Alcohol  and or any Recreational drug use.  Wear Seat belts while driving.   Please note  You were cared for by a hospitalist during your hospital stay. If you have any questions about your discharge medications or the care you received while you were in the hospital after you are discharged, you can call the unit and asked to speak with the hospitalist on call if the hospitalist that took care of you is not available. Once you are discharged, your primary care physician will handle any further medical issues. Please note that NO REFILLS for any discharge medications will be authorized once you are discharged, as it is imperative that you return to your primary care physician (or establish a relationship with a primary care physician if you do not have one) for your aftercare needs so that they can reassess your need for medications and monitor your lab values.     Increase activity slowly    Complete by:  As directed              Discharge Medications         Medication List    TAKE these medications        ALLERGY MEDICATION PO  Take by mouth every morning. "Equate" allergy medication.     cefpodoxime 200 MG tablet  Commonly known as:  VANTIN  Take 1 tablet (200 mg total) by mouth 2 (two) times daily.     DOCUSATE SODIUM PO  Take 1 capsule by mouth 2 (two) times daily.     omeprazole 20 MG tablet  Commonly known as:  PRILOSEC OTC  Take 20 mg by mouth daily.  Major procedures and Radiology Reports - PLEASE review detailed and final reports for all details, in brief -    US Renal  07/29/2015  CLINICAL DATA:  Pyelonephritis. EXAM: RENAL / URINARY TRACT ULTRASOUND COMPLETE COMPARISON:  None. FINDINGS: Right Kidney: Length: 12.2 cm. Echogenicity within normal limits. No mass or hydronephrosis visualized. No perinephric fluid. Left Kidney: Length: 11.6 cm. Echogenicity within normal limits. No mass or hydronephrosis visualized. No perinephric fluid. Bladder: Appears normal for degree of bladder distention. IMPRESSION: Normal renal ultrasound. Electronically Signed   By: Bary Richard M.D.   On: 07/29/2015 11:07    Micro Results      Recent Results (from the past 240 hour(s))  Urine culture     Status: Abnormal (Preliminary result)   Collection Time: 07/28/15  4:40 PM  Result Value Ref Range Status   Specimen Description URINE, CATHETERIZED  Final   Special Requests NONE  Final   Culture (A)  Final    >=100,000 COLONIES/mL GRAM NEGATIVE RODS IDENTIFICATION AND SUSCEPTIBILITIES TO FOLLOW Performed at [facility]    Report Status PENDING  Incomplete  Blood culture (routine x 2)     Status: None (Preliminary result)   Collection Time: 07/28/15  6:35 PM  Result Value Ref Range Status   Specimen Description BLOOD RIGHT ANTECUBITAL  Final   Special Requests BOTTLES DRAWN AEROBIC AND ANAEROBIC  Final   Culture   Final    NO GROWTH < 12 HOURS Performed at [facility]    Report Status PENDING  Incomplete   Blood culture (routine x 2)     Status: None (Preliminary result)   Collection Time: 07/28/15  6:40 PM  Result Value Ref Range Status   Specimen Description BLOOD LEFT ANTECUBITAL  Final   Special Requests BOTTLES DRAWN AEROBIC AND ANAEROBIC  Final   Culture   Final    NO GROWTH < 12 HOURS Performed at [facility]    Report Status PENDING  Incomplete       Today   Subjective    Michiel Cowboy today has no headache,no chest abdominal pain,no new weakness tingling or numbness, feels much better wants to go home today.    Objective   Blood pressure 126/68, pulse 75, temperature 98.1 F (36.7 C), temperature source Oral, resp. rate 20, height  (1.575 m), weight 60.419 kg (133 lb 3.2 oz), SpO2 98 %.   Intake/Output Summary (Last 24 hours) at 07/30/15 0947 Last data filed at 07/30/15 0400  Gross per 24 hour  Intake 2147.5 ml  Output   1600 ml  Net  547.5 ml    Exam Awake Alert, Oriented x 3, No new F.N deficits, Normal affect [place].AT,PERRAL Supple Neck,No JVD, No cervical lymphadenopathy appriciated.  Symmetrical Chest wall movement, Good air movement bilaterally, CTAB RRR,No Gallops,Rubs or new Murmurs, No Parasternal Heave +ve B.Sounds, Abd Soft, Non tender, No organomegaly appriciated, No rebound -guarding or rigidity. No Cyanosis, Clubbing or edema, No new Rash or bruise   Data Review   CBC w Diff: Lab Results  Component Value Date   WBC 14.1* 07/29/2015   HGB 8.8* 07/29/2015   HCT 26.8* 07/29/2015   PLT 115* 07/29/2015   LYMPHOPCT 7 07/28/2015   BANDSPCT 0 07/28/2015   MONOPCT 8 07/28/2015   EOSPCT 0 07/28/2015   BASOPCT 0 07/28/2015    CMP: Lab Results  Component Value Date   NA 134* 07/29/2015   K 3.7 07/29/2015   CL 104  07/29/2015   CO2 24 07/29/2015   BUN 15 07/29/2015   CREATININE 0.69 07/29/2015   PROT 7.9 07/28/2015   ALBUMIN 3.6 07/28/2015   BILITOT 1.1 07/28/2015   ALKPHOS 56 07/28/2015   AST 20 07/28/2015   ALT 17  07/28/2015  .   Total Time in preparing paper work, data evaluation and todays exam - 35 minutes  Leroy SeaSINGH,Rayquon Uselman K M.D on 07/30/2015 at 9:47 AM  [facility]   Office  [phone]

## 2015-08-01 LAB — URINE CULTURE: Culture: >=100000 — AB

## 2015-08-02 LAB — CULTURE, BLOOD (ROUTINE X 2)
CULTURE: NO GROWTH
Culture: NO GROWTH

## 2015-08-03 DIAGNOSIS — N10 Acute tubulo-interstitial nephritis: Secondary | ICD-10-CM | POA: Diagnosis not present

## 2015-08-03 DIAGNOSIS — K219 Gastro-esophageal reflux disease without esophagitis: Secondary | ICD-10-CM | POA: Diagnosis not present

## 2015-08-03 DIAGNOSIS — D649 Anemia, unspecified: Secondary | ICD-10-CM | POA: Diagnosis not present

## 2015-08-03 DIAGNOSIS — H9193 Unspecified hearing loss, bilateral: Secondary | ICD-10-CM | POA: Diagnosis not present

## 2015-08-15 DIAGNOSIS — D649 Anemia, unspecified: Secondary | ICD-10-CM | POA: Diagnosis not present

## 2015-09-04 DIAGNOSIS — E7800 Pure hypercholesterolemia, unspecified: Secondary | ICD-10-CM | POA: Diagnosis not present

## 2015-09-04 DIAGNOSIS — Z23 Encounter for immunization: Secondary | ICD-10-CM | POA: Diagnosis not present

## 2015-09-04 DIAGNOSIS — D649 Anemia, unspecified: Secondary | ICD-10-CM | POA: Diagnosis not present

## 2015-09-04 DIAGNOSIS — Z0000 Encounter for general adult medical examination without abnormal findings: Secondary | ICD-10-CM | POA: Diagnosis not present

## 2015-10-02 DIAGNOSIS — D649 Anemia, unspecified: Secondary | ICD-10-CM | POA: Diagnosis not present

## 2015-10-24 DIAGNOSIS — D72819 Decreased white blood cell count, unspecified: Secondary | ICD-10-CM | POA: Diagnosis not present

## 2015-12-11 DIAGNOSIS — M19041 Primary osteoarthritis, right hand: Secondary | ICD-10-CM | POA: Diagnosis not present

## 2015-12-11 DIAGNOSIS — M25561 Pain in right knee: Secondary | ICD-10-CM | POA: Diagnosis not present

## 2015-12-19 DIAGNOSIS — H00015 Hordeolum externum left lower eyelid: Secondary | ICD-10-CM | POA: Diagnosis not present

## 2016-03-04 DIAGNOSIS — L659 Nonscarring hair loss, unspecified: Secondary | ICD-10-CM | POA: Diagnosis not present

## 2016-04-02 DIAGNOSIS — L638 Other alopecia areata: Secondary | ICD-10-CM | POA: Diagnosis not present

## 2016-04-30 DIAGNOSIS — L638 Other alopecia areata: Secondary | ICD-10-CM | POA: Diagnosis not present

## 2016-06-12 DIAGNOSIS — H5213 Myopia, bilateral: Secondary | ICD-10-CM | POA: Diagnosis not present

## 2016-11-21 DIAGNOSIS — Z23 Encounter for immunization: Secondary | ICD-10-CM | POA: Diagnosis not present

## 2017-03-03 DIAGNOSIS — Z0000 Encounter for general adult medical examination without abnormal findings: Secondary | ICD-10-CM | POA: Diagnosis not present

## 2017-03-03 DIAGNOSIS — K219 Gastro-esophageal reflux disease without esophagitis: Secondary | ICD-10-CM | POA: Diagnosis not present

## 2017-03-03 DIAGNOSIS — E7800 Pure hypercholesterolemia, unspecified: Secondary | ICD-10-CM | POA: Diagnosis not present

## 2017-03-03 DIAGNOSIS — D72819 Decreased white blood cell count, unspecified: Secondary | ICD-10-CM | POA: Diagnosis not present

## 2017-04-27 DIAGNOSIS — H00014 Hordeolum externum left upper eyelid: Secondary | ICD-10-CM | POA: Diagnosis not present

## 2017-04-27 DIAGNOSIS — H1851 Endothelial corneal dystrophy: Secondary | ICD-10-CM | POA: Diagnosis not present

## 2017-04-30 DIAGNOSIS — M8588 Other specified disorders of bone density and structure, other site: Secondary | ICD-10-CM | POA: Diagnosis not present

## 2017-04-30 DIAGNOSIS — E2839 Other primary ovarian failure: Secondary | ICD-10-CM | POA: Diagnosis not present

## 2017-08-26 DIAGNOSIS — L309 Dermatitis, unspecified: Secondary | ICD-10-CM | POA: Diagnosis not present

## 2017-10-28 DIAGNOSIS — H5213 Myopia, bilateral: Secondary | ICD-10-CM | POA: Diagnosis not present

## 2017-11-24 DIAGNOSIS — Z23 Encounter for immunization: Secondary | ICD-10-CM | POA: Diagnosis not present

## 2018-01-28 DIAGNOSIS — J069 Acute upper respiratory infection, unspecified: Secondary | ICD-10-CM | POA: Diagnosis not present

## 2018-03-08 DIAGNOSIS — K219 Gastro-esophageal reflux disease without esophagitis: Secondary | ICD-10-CM | POA: Diagnosis not present

## 2018-03-08 DIAGNOSIS — E7800 Pure hypercholesterolemia, unspecified: Secondary | ICD-10-CM | POA: Diagnosis not present

## 2018-03-08 DIAGNOSIS — Z0000 Encounter for general adult medical examination without abnormal findings: Secondary | ICD-10-CM | POA: Diagnosis not present

## 2018-03-08 DIAGNOSIS — I10 Essential (primary) hypertension: Secondary | ICD-10-CM | POA: Diagnosis not present

## 2018-11-30 DIAGNOSIS — H04123 Dry eye syndrome of bilateral lacrimal glands: Secondary | ICD-10-CM | POA: Diagnosis not present

## 2019-03-07 ENCOUNTER — Ambulatory Visit: Payer: Medicare Other

## 2019-03-07 ENCOUNTER — Ambulatory Visit: Payer: Medicare Other | Attending: Internal Medicine

## 2019-03-07 DIAGNOSIS — Z23 Encounter for immunization: Secondary | ICD-10-CM | POA: Insufficient documentation

## 2019-03-07 NOTE — Progress Notes (Signed)
   Covid-19 Vaccination Clinic  Name:  Patricia Benitez    MRN: [id number] DOB: 1940-03-13  03/07/2019  [name] was observed post Covid-19 immunization for 15 minutes without incidence. She was provided with Vaccine Information Sheet and instruction to access the V-Safe system.   [name] was instructed to call 911 with any severe reactions post vaccine: Marland Kitchen Difficulty breathing  . Swelling of your face and throat  . A fast heartbeat  . A bad rash all over your body  . Dizziness and weakness    Immunizations Administered    Name Date Dose VIS Date Route   Pfizer COVID-19 Vaccine 03/07/2019 11:36 AM 0.3 mL 01/14/2019 Intramuscular   Manufacturer: [employer], [employer]   Lot: [id number]   NDC: [address]-7

## 2019-03-28 DIAGNOSIS — Z0000 Encounter for general adult medical examination without abnormal findings: Secondary | ICD-10-CM | POA: Diagnosis not present

## 2019-03-28 DIAGNOSIS — K219 Gastro-esophageal reflux disease without esophagitis: Secondary | ICD-10-CM | POA: Diagnosis not present

## 2019-03-28 DIAGNOSIS — I10 Essential (primary) hypertension: Secondary | ICD-10-CM | POA: Diagnosis not present

## 2019-03-28 DIAGNOSIS — E7800 Pure hypercholesterolemia, unspecified: Secondary | ICD-10-CM | POA: Diagnosis not present

## 2019-03-31 ENCOUNTER — Ambulatory Visit: Payer: Medicare Other | Attending: Internal Medicine

## 2019-03-31 DIAGNOSIS — Z23 Encounter for immunization: Secondary | ICD-10-CM | POA: Insufficient documentation

## 2019-03-31 NOTE — Progress Notes (Signed)
   Covid-19 Vaccination Clinic  Name:  Patricia Benitez    MRN: [id number] DOB: 1940/11/25  03/31/2019  [name] was observed post Covid-19 immunization for 15 minutes without incidence. She was provided with Vaccine Information Sheet and instruction to access the V-Safe system.   [name] was instructed to call 911 with any severe reactions post vaccine: Marland Kitchen Difficulty breathing  . Swelling of your face and throat  . A fast heartbeat  . A bad rash all over your body  . Dizziness and weakness    Immunizations Administered    Name Date Dose VIS Date Route   Pfizer COVID-19 Vaccine 03/31/2019 10:41 AM 0.3 mL 01/14/2019 Intramuscular   Manufacturer: [employer], [employer]   Lot: [id number]   NDC: [address]-2

## 2019-05-31 DIAGNOSIS — H50011 Monocular esotropia, right eye: Secondary | ICD-10-CM | POA: Diagnosis not present

## 2019-06-02 DIAGNOSIS — H5032 Intermittent alternating esotropia: Secondary | ICD-10-CM | POA: Diagnosis not present

## 2019-08-03 DIAGNOSIS — R300 Dysuria: Secondary | ICD-10-CM | POA: Diagnosis not present

## 2019-10-03 DIAGNOSIS — K219 Gastro-esophageal reflux disease without esophagitis: Secondary | ICD-10-CM | POA: Diagnosis not present

## 2019-10-03 DIAGNOSIS — D649 Anemia, unspecified: Secondary | ICD-10-CM | POA: Diagnosis not present

## 2019-10-03 DIAGNOSIS — E7800 Pure hypercholesterolemia, unspecified: Secondary | ICD-10-CM | POA: Diagnosis not present

## 2019-10-03 DIAGNOSIS — I10 Essential (primary) hypertension: Secondary | ICD-10-CM | POA: Diagnosis not present

## 2019-10-04 ENCOUNTER — Other Ambulatory Visit: Payer: Self-pay | Admitting: Family Medicine

## 2019-10-04 DIAGNOSIS — M85851 Other specified disorders of bone density and structure, right thigh: Secondary | ICD-10-CM

## 2019-10-20 DIAGNOSIS — H50011 Monocular esotropia, right eye: Secondary | ICD-10-CM | POA: Diagnosis not present

## 2019-10-24 DIAGNOSIS — H25812 Combined forms of age-related cataract, left eye: Secondary | ICD-10-CM | POA: Diagnosis not present

## 2019-11-02 DIAGNOSIS — H2512 Age-related nuclear cataract, left eye: Secondary | ICD-10-CM | POA: Diagnosis not present

## 2019-11-02 DIAGNOSIS — H25812 Combined forms of age-related cataract, left eye: Secondary | ICD-10-CM | POA: Diagnosis not present

## 2020-01-19 ENCOUNTER — Other Ambulatory Visit: Payer: Self-pay

## 2020-01-19 ENCOUNTER — Ambulatory Visit
Admission: RE | Admit: 2020-01-19 | Discharge: 2020-01-19 | Disposition: A | Discharge status: Home / Self Care | Payer: Medicare Other | Source: Ambulatory Visit | Attending: Family Medicine | Admitting: Family Medicine

## 2020-01-19 DIAGNOSIS — M85851 Other specified disorders of bone density and structure, right thigh: Secondary | ICD-10-CM

## 2020-01-19 DIAGNOSIS — Z780 Asymptomatic menopausal state: Secondary | ICD-10-CM | POA: Diagnosis not present

## 2020-03-30 DIAGNOSIS — M19041 Primary osteoarthritis, right hand: Secondary | ICD-10-CM | POA: Diagnosis not present

## 2020-03-30 DIAGNOSIS — F4321 Adjustment disorder with depressed mood: Secondary | ICD-10-CM | POA: Diagnosis not present

## 2020-03-30 DIAGNOSIS — Z0000 Encounter for general adult medical examination without abnormal findings: Secondary | ICD-10-CM | POA: Diagnosis not present

## 2020-03-30 DIAGNOSIS — I10 Essential (primary) hypertension: Secondary | ICD-10-CM | POA: Diagnosis not present

## 2020-08-03 ENCOUNTER — Emergency Department (HOSPITAL_COMMUNITY)
Admission: EM | Admit: 2020-08-03 | Discharge: 2020-08-03 | Disposition: A | Discharge status: Home / Self Care | Payer: Medicare Other | Attending: Emergency Medicine | Admitting: Emergency Medicine

## 2020-08-03 ENCOUNTER — Emergency Department (HOSPITAL_COMMUNITY): Payer: Medicare Other

## 2020-08-03 DIAGNOSIS — R911 Solitary pulmonary nodule: Secondary | ICD-10-CM | POA: Diagnosis not present

## 2020-08-03 DIAGNOSIS — Z5321 Procedure and treatment not carried out due to patient leaving prior to being seen by health care provider: Secondary | ICD-10-CM | POA: Insufficient documentation

## 2020-08-03 DIAGNOSIS — R0789 Other chest pain: Secondary | ICD-10-CM | POA: Diagnosis not present

## 2020-08-03 DIAGNOSIS — R5381 Other malaise: Secondary | ICD-10-CM | POA: Diagnosis not present

## 2020-08-03 DIAGNOSIS — R110 Nausea: Secondary | ICD-10-CM | POA: Insufficient documentation

## 2020-08-03 DIAGNOSIS — R079 Chest pain, unspecified: Secondary | ICD-10-CM | POA: Diagnosis not present

## 2020-08-03 DIAGNOSIS — R5383 Other fatigue: Secondary | ICD-10-CM | POA: Insufficient documentation

## 2020-08-03 DIAGNOSIS — R9431 Abnormal electrocardiogram [ECG] [EKG]: Secondary | ICD-10-CM | POA: Diagnosis not present

## 2020-08-03 DIAGNOSIS — Z03818 Encounter for observation for suspected exposure to other biological agents ruled out: Secondary | ICD-10-CM | POA: Diagnosis not present

## 2020-08-03 DIAGNOSIS — R197 Diarrhea, unspecified: Secondary | ICD-10-CM | POA: Diagnosis not present

## 2020-08-03 LAB — CBC WITH DIFFERENTIAL/PLATELET
Abs Immature Granulocytes: 0.09 K/uL — ABNORMAL HIGH (ref 0.00–0.07)
Basophils Absolute: 0 K/uL (ref 0.0–0.1)
Basophils Relative: 0 %
Eosinophils Absolute: 0 K/uL (ref 0.0–0.5)
Eosinophils Relative: 0 %
HCT: 36.5 % (ref 36.0–46.0)
Hemoglobin: 11.8 g/dL — ABNORMAL LOW (ref 12.0–15.0)
Immature Granulocytes: 2 %
Lymphocytes Relative: 32 %
Lymphs Abs: 1.8 K/uL (ref 0.7–4.0)
MCH: 30 pg (ref 26.0–34.0)
MCHC: 32.3 g/dL (ref 30.0–36.0)
MCV: 92.9 fL (ref 80.0–100.0)
Monocytes Absolute: 0.9 K/uL (ref 0.1–1.0)
Monocytes Relative: 15 %
Neutro Abs: 2.9 K/uL (ref 1.7–7.7)
Neutrophils Relative %: 51 %
Platelets: UNDETERMINED K/uL (ref 150–400)
RBC: 3.93 MIL/uL (ref 3.87–5.11)
RDW: 13.3 % (ref 11.5–15.5)
WBC: 5.7 K/uL (ref 4.0–10.5)
nRBC: 0 % (ref 0.0–0.2)

## 2020-08-03 LAB — URINALYSIS, ROUTINE W REFLEX MICROSCOPIC
Bilirubin Urine: NEGATIVE
Glucose, UA: NEGATIVE mg/dL
Hgb urine dipstick: NEGATIVE
Ketones, ur: NEGATIVE mg/dL
Nitrite: NEGATIVE
Protein, ur: NEGATIVE mg/dL
Specific Gravity, Urine: 1.019 (ref 1.005–1.030)
pH: 5 (ref 5.0–8.0)

## 2020-08-03 LAB — COMPREHENSIVE METABOLIC PANEL WITH GFR
ALT: 18 U/L (ref 0–44)
AST: 18 U/L (ref 15–41)
Albumin: 4.3 g/dL (ref 3.5–5.0)
Alkaline Phosphatase: 63 U/L (ref 38–126)
Anion gap: 8 (ref 5–15)
BUN: 17 mg/dL (ref 8–23)
CO2: 24 mmol/L (ref 22–32)
Calcium: 9.7 mg/dL (ref 8.9–10.3)
Chloride: 105 mmol/L (ref 98–111)
Creatinine, Ser: 0.85 mg/dL (ref 0.44–1.00)
GFR, Estimated: >60 mL/min (ref >60)
Glucose, Bld: 132 mg/dL — ABNORMAL HIGH (ref 70–99)
Potassium: 3.9 mmol/L (ref 3.5–5.1)
Sodium: 137 mmol/L (ref 135–145)
Total Bilirubin: 0.6 mg/dL (ref 0.3–1.2)
Total Protein: 7.6 g/dL (ref 6.5–8.1)

## 2020-08-03 LAB — TROPONIN I (HIGH SENSITIVITY)
Troponin I (High Sensitivity): 3 ng/L (ref <18)
Troponin I (High Sensitivity): 3 ng/L (ref <18)

## 2020-08-03 LAB — LIPASE, BLOOD: Lipase: 38 U/L (ref 11–51)

## 2020-08-03 NOTE — ED Provider Notes (Signed)
Emergency Medicine Provider Triage Evaluation Note  Patricia Benitez , a 80 y.o. female  was evaluated in triage.  Pt complains of burning chest pain. States for the past few months she has been more fatigued than normal, having intermittent nausea and needing to nap more often. Over the past couple of days she has been experiencing central burning CP. No SOB or vomiting. No urinary complaints.   Physical Exam  BP (!) 170/116   Pulse (!) 111   Temp 98.1 F (36.7 C) (Oral)   Resp 20   SpO2 98%  Gen:   Awake, no distress   Resp:  Normal effort  MSK:   Moves extremities without difficulty  Other:    Medical Decision Making  Medically screening exam initiated at 3:03 PM.  Appropriate orders placed.  Donis Kotowski Wintle was informed that the remainder of the evaluation will be completed by another provider, this initial triage assessment does not replace that evaluation, and the importance of remaining in the ED until their evaluation is complete.   Placido Sou, PA-C 08/03/20 1504    Horton, Clabe Seal, DO 08/06/20 1745

## 2020-08-03 NOTE — ED Triage Notes (Signed)
Pt states she is been feeling sick for the past few weeks and is having a burning sensation on her mid chest. No SOB, nausea or vomiting.

## 2020-08-03 NOTE — ED Notes (Signed)
Patient left on own accord °

## 2020-08-16 DIAGNOSIS — D696 Thrombocytopenia, unspecified: Secondary | ICD-10-CM | POA: Diagnosis not present

## 2020-08-16 DIAGNOSIS — I10 Essential (primary) hypertension: Secondary | ICD-10-CM | POA: Diagnosis not present

## 2020-08-22 DIAGNOSIS — S80861A Insect bite (nonvenomous), right lower leg, initial encounter: Secondary | ICD-10-CM | POA: Diagnosis not present

## 2020-08-22 DIAGNOSIS — W57XXXA Bitten or stung by nonvenomous insect and other nonvenomous arthropods, initial encounter: Secondary | ICD-10-CM | POA: Diagnosis not present

## 2020-08-28 DIAGNOSIS — H2511 Age-related nuclear cataract, right eye: Secondary | ICD-10-CM | POA: Diagnosis not present

## 2020-09-11 DIAGNOSIS — M85851 Other specified disorders of bone density and structure, right thigh: Secondary | ICD-10-CM | POA: Diagnosis not present

## 2020-09-11 DIAGNOSIS — E7800 Pure hypercholesterolemia, unspecified: Secondary | ICD-10-CM | POA: Diagnosis not present

## 2020-09-11 DIAGNOSIS — I10 Essential (primary) hypertension: Secondary | ICD-10-CM | POA: Diagnosis not present

## 2020-09-11 DIAGNOSIS — R140 Abdominal distension (gaseous): Secondary | ICD-10-CM | POA: Diagnosis not present

## 2020-09-11 DIAGNOSIS — K219 Gastro-esophageal reflux disease without esophagitis: Secondary | ICD-10-CM | POA: Diagnosis not present

## 2020-09-19 DIAGNOSIS — H2511 Age-related nuclear cataract, right eye: Secondary | ICD-10-CM | POA: Diagnosis not present

## 2020-11-23 ENCOUNTER — Other Ambulatory Visit: Payer: Self-pay | Admitting: Physician Assistant

## 2020-11-23 DIAGNOSIS — D649 Anemia, unspecified: Secondary | ICD-10-CM | POA: Diagnosis not present

## 2020-11-23 DIAGNOSIS — D696 Thrombocytopenia, unspecified: Secondary | ICD-10-CM | POA: Diagnosis not present

## 2020-11-23 DIAGNOSIS — R1010 Upper abdominal pain, unspecified: Secondary | ICD-10-CM

## 2020-11-23 DIAGNOSIS — R140 Abdominal distension (gaseous): Secondary | ICD-10-CM | POA: Diagnosis not present

## 2020-11-27 DIAGNOSIS — D649 Anemia, unspecified: Secondary | ICD-10-CM | POA: Diagnosis not present

## 2020-12-12 ENCOUNTER — Ambulatory Visit
Admission: RE | Admit: 2020-12-12 | Discharge: 2020-12-12 | Disposition: A | Discharge status: Home / Self Care | Payer: Medicare Other | Source: Ambulatory Visit | Attending: Physician Assistant | Admitting: Physician Assistant

## 2020-12-12 ENCOUNTER — Other Ambulatory Visit: Payer: Self-pay

## 2020-12-12 DIAGNOSIS — R140 Abdominal distension (gaseous): Secondary | ICD-10-CM | POA: Diagnosis not present

## 2020-12-12 DIAGNOSIS — R1010 Upper abdominal pain, unspecified: Secondary | ICD-10-CM

## 2020-12-12 DIAGNOSIS — I700 Atherosclerosis of aorta: Secondary | ICD-10-CM | POA: Diagnosis not present

## 2020-12-12 MED ORDER — IOPAMIDOL (ISOVUE-300) INJECTION 61%
100.0000 mL | Freq: Once | INTRAVENOUS | Status: AC | PRN
  Administered 2020-12-12: 100 mL via INTRAVENOUS

## 2021-01-09 DIAGNOSIS — K219 Gastro-esophageal reflux disease without esophagitis: Secondary | ICD-10-CM | POA: Diagnosis not present

## 2021-01-09 DIAGNOSIS — K5900 Constipation, unspecified: Secondary | ICD-10-CM | POA: Diagnosis not present

## 2021-04-02 DIAGNOSIS — K219 Gastro-esophageal reflux disease without esophagitis: Secondary | ICD-10-CM | POA: Diagnosis not present

## 2021-04-02 DIAGNOSIS — I10 Essential (primary) hypertension: Secondary | ICD-10-CM | POA: Diagnosis not present

## 2021-04-02 DIAGNOSIS — E7800 Pure hypercholesterolemia, unspecified: Secondary | ICD-10-CM | POA: Diagnosis not present

## 2021-04-02 DIAGNOSIS — D691 Qualitative platelet defects: Secondary | ICD-10-CM | POA: Diagnosis not present

## 2021-04-02 DIAGNOSIS — Z0000 Encounter for general adult medical examination without abnormal findings: Secondary | ICD-10-CM | POA: Diagnosis not present

## 2021-04-02 DIAGNOSIS — M19041 Primary osteoarthritis, right hand: Secondary | ICD-10-CM | POA: Diagnosis not present

## 2021-09-04 DIAGNOSIS — H5203 Hypermetropia, bilateral: Secondary | ICD-10-CM | POA: Diagnosis not present

## 2021-10-01 DIAGNOSIS — E7800 Pure hypercholesterolemia, unspecified: Secondary | ICD-10-CM | POA: Diagnosis not present

## 2021-10-01 DIAGNOSIS — M19041 Primary osteoarthritis, right hand: Secondary | ICD-10-CM | POA: Diagnosis not present

## 2021-10-01 DIAGNOSIS — D691 Qualitative platelet defects: Secondary | ICD-10-CM | POA: Diagnosis not present

## 2021-10-01 DIAGNOSIS — K219 Gastro-esophageal reflux disease without esophagitis: Secondary | ICD-10-CM | POA: Diagnosis not present

## 2021-10-01 DIAGNOSIS — M85851 Other specified disorders of bone density and structure, right thigh: Secondary | ICD-10-CM | POA: Diagnosis not present

## 2021-10-01 DIAGNOSIS — I10 Essential (primary) hypertension: Secondary | ICD-10-CM | POA: Diagnosis not present

## 2021-12-06 DIAGNOSIS — Z23 Encounter for immunization: Secondary | ICD-10-CM | POA: Diagnosis not present

## 2022-04-14 DIAGNOSIS — Z23 Encounter for immunization: Secondary | ICD-10-CM | POA: Diagnosis not present

## 2022-04-14 DIAGNOSIS — E7800 Pure hypercholesterolemia, unspecified: Secondary | ICD-10-CM | POA: Diagnosis not present

## 2022-04-14 DIAGNOSIS — D691 Qualitative platelet defects: Secondary | ICD-10-CM | POA: Diagnosis not present

## 2022-04-14 DIAGNOSIS — Z1331 Encounter for screening for depression: Secondary | ICD-10-CM | POA: Diagnosis not present

## 2022-04-14 DIAGNOSIS — Z0000 Encounter for general adult medical examination without abnormal findings: Secondary | ICD-10-CM | POA: Diagnosis not present

## 2022-04-14 DIAGNOSIS — M19041 Primary osteoarthritis, right hand: Secondary | ICD-10-CM | POA: Diagnosis not present

## 2022-04-14 DIAGNOSIS — I10 Essential (primary) hypertension: Secondary | ICD-10-CM | POA: Diagnosis not present

## 2022-04-14 DIAGNOSIS — Z1211 Encounter for screening for malignant neoplasm of colon: Secondary | ICD-10-CM | POA: Diagnosis not present

## 2022-06-11 ENCOUNTER — Institutional Professional Consult (permissible substitution): Payer: Medicare Other | Admitting: Plastic Surgery

## 2022-08-18 DIAGNOSIS — K080 Exfoliation of teeth due to systemic causes: Secondary | ICD-10-CM | POA: Diagnosis not present

## 2022-09-22 DIAGNOSIS — N9489 Other specified conditions associated with female genital organs and menstrual cycle: Secondary | ICD-10-CM | POA: Diagnosis not present

## 2022-10-02 DIAGNOSIS — D691 Qualitative platelet defects: Secondary | ICD-10-CM | POA: Diagnosis not present

## 2022-10-02 DIAGNOSIS — K219 Gastro-esophageal reflux disease without esophagitis: Secondary | ICD-10-CM | POA: Diagnosis not present

## 2022-10-02 DIAGNOSIS — I10 Essential (primary) hypertension: Secondary | ICD-10-CM | POA: Diagnosis not present

## 2022-10-02 DIAGNOSIS — E7800 Pure hypercholesterolemia, unspecified: Secondary | ICD-10-CM | POA: Diagnosis not present

## 2022-10-02 DIAGNOSIS — M85851 Other specified disorders of bone density and structure, right thigh: Secondary | ICD-10-CM | POA: Diagnosis not present

## 2022-10-22 DIAGNOSIS — N898 Other specified noninflammatory disorders of vagina: Secondary | ICD-10-CM | POA: Diagnosis not present

## 2022-10-25 DIAGNOSIS — R350 Frequency of micturition: Secondary | ICD-10-CM | POA: Diagnosis not present

## 2022-10-25 DIAGNOSIS — N390 Urinary tract infection, site not specified: Secondary | ICD-10-CM | POA: Diagnosis not present

## 2022-11-21 DIAGNOSIS — N390 Urinary tract infection, site not specified: Secondary | ICD-10-CM | POA: Diagnosis not present

## 2022-12-10 DIAGNOSIS — N898 Other specified noninflammatory disorders of vagina: Secondary | ICD-10-CM | POA: Diagnosis not present

## 2022-12-16 DIAGNOSIS — N9489 Other specified conditions associated with female genital organs and menstrual cycle: Secondary | ICD-10-CM | POA: Diagnosis not present

## 2022-12-16 DIAGNOSIS — N958 Other specified menopausal and perimenopausal disorders: Secondary | ICD-10-CM | POA: Diagnosis not present

## 2022-12-16 DIAGNOSIS — N898 Other specified noninflammatory disorders of vagina: Secondary | ICD-10-CM | POA: Diagnosis not present

## 2023-01-13 IMAGING — DX DG CHEST 1V
1 series · 1 of 1 positions shown · non-contrast
Comparison: None.

CLINICAL DATA: Chest pain.

EXAM:
CHEST  1 VIEW

[chest pa]
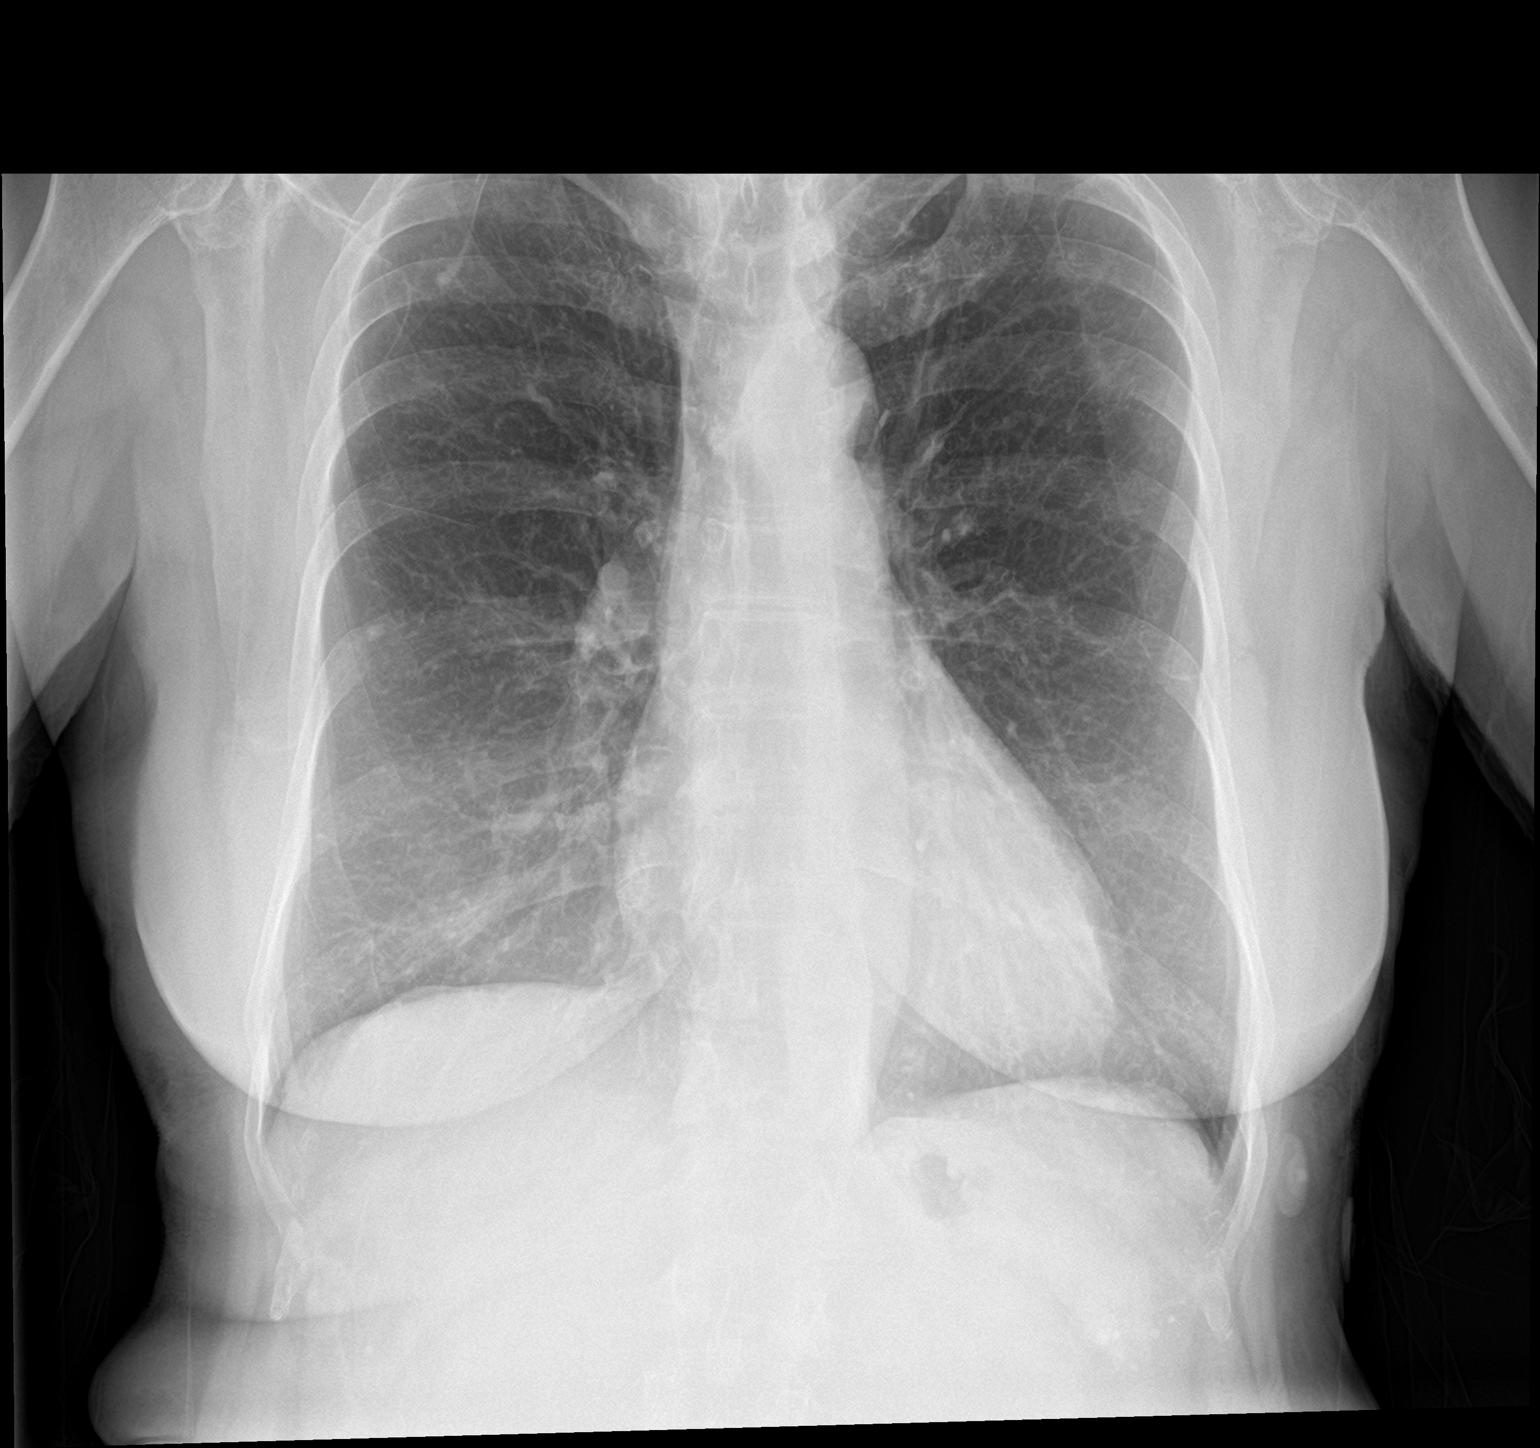

[1 of 1 positions shown; findings below may reference images not displayed]

FINDINGS: The lungs are hyperinflated. Ill-defined, subcentimeter calcified
lung nodules are seen within the lateral aspects of the mid and
upper right lung. There is no evidence of acute infiltrate, pleural
effusion or pneumothorax. The heart size and mediastinal contours
are within normal limits. The visualized skeletal structures are
unremarkable.
IMPRESSION: No acute cardiopulmonary disease.

## 2023-01-21 DIAGNOSIS — K080 Exfoliation of teeth due to systemic causes: Secondary | ICD-10-CM | POA: Diagnosis not present

## 2023-01-22 DIAGNOSIS — K080 Exfoliation of teeth due to systemic causes: Secondary | ICD-10-CM | POA: Diagnosis not present

## 2023-02-11 DIAGNOSIS — K080 Exfoliation of teeth due to systemic causes: Secondary | ICD-10-CM | POA: Diagnosis not present

## 2023-02-18 DIAGNOSIS — K080 Exfoliation of teeth due to systemic causes: Secondary | ICD-10-CM | POA: Diagnosis not present

## 2023-02-19 DIAGNOSIS — H18513 Endothelial corneal dystrophy, bilateral: Secondary | ICD-10-CM | POA: Diagnosis not present

## 2023-02-19 DIAGNOSIS — H52223 Regular astigmatism, bilateral: Secondary | ICD-10-CM | POA: Diagnosis not present

## 2023-02-23 DIAGNOSIS — K080 Exfoliation of teeth due to systemic causes: Secondary | ICD-10-CM | POA: Diagnosis not present

## 2023-03-10 DIAGNOSIS — K080 Exfoliation of teeth due to systemic causes: Secondary | ICD-10-CM | POA: Diagnosis not present

## 2023-05-01 ENCOUNTER — Other Ambulatory Visit: Payer: Self-pay | Admitting: Family Medicine

## 2023-05-01 DIAGNOSIS — Z1331 Encounter for screening for depression: Secondary | ICD-10-CM | POA: Diagnosis not present

## 2023-05-01 DIAGNOSIS — D691 Qualitative platelet defects: Secondary | ICD-10-CM | POA: Diagnosis not present

## 2023-05-01 DIAGNOSIS — M85851 Other specified disorders of bone density and structure, right thigh: Secondary | ICD-10-CM | POA: Diagnosis not present

## 2023-05-01 DIAGNOSIS — E7800 Pure hypercholesterolemia, unspecified: Secondary | ICD-10-CM | POA: Diagnosis not present

## 2023-05-01 DIAGNOSIS — I10 Essential (primary) hypertension: Secondary | ICD-10-CM | POA: Diagnosis not present

## 2023-05-01 DIAGNOSIS — Z0000 Encounter for general adult medical examination without abnormal findings: Secondary | ICD-10-CM | POA: Diagnosis not present

## 2023-08-14 DIAGNOSIS — H00021 Hordeolum internum right upper eyelid: Secondary | ICD-10-CM | POA: Diagnosis not present

## 2023-08-18 DIAGNOSIS — H00011 Hordeolum externum right upper eyelid: Secondary | ICD-10-CM | POA: Diagnosis not present

## 2023-08-25 DIAGNOSIS — K080 Exfoliation of teeth due to systemic causes: Secondary | ICD-10-CM | POA: Diagnosis not present

## 2023-08-31 DIAGNOSIS — K080 Exfoliation of teeth due to systemic causes: Secondary | ICD-10-CM | POA: Diagnosis not present

## 2023-11-03 DIAGNOSIS — K219 Gastro-esophageal reflux disease without esophagitis: Secondary | ICD-10-CM | POA: Diagnosis not present

## 2023-11-03 DIAGNOSIS — Z23 Encounter for immunization: Secondary | ICD-10-CM | POA: Diagnosis not present

## 2023-11-03 DIAGNOSIS — M85851 Other specified disorders of bone density and structure, right thigh: Secondary | ICD-10-CM | POA: Diagnosis not present

## 2023-11-03 DIAGNOSIS — D691 Qualitative platelet defects: Secondary | ICD-10-CM | POA: Diagnosis not present

## 2023-11-03 DIAGNOSIS — I10 Essential (primary) hypertension: Secondary | ICD-10-CM | POA: Diagnosis not present

## 2023-11-03 DIAGNOSIS — E7800 Pure hypercholesterolemia, unspecified: Secondary | ICD-10-CM | POA: Diagnosis not present

## 2024-01-13 ENCOUNTER — Other Ambulatory Visit

## 2024-01-19 DIAGNOSIS — K080 Exfoliation of teeth due to systemic causes: Secondary | ICD-10-CM | POA: Diagnosis not present
